# Patient Record
Sex: Female | Born: 1990 | Race: White | Hispanic: No | Marital: Married | Smoking: Never smoker
Health system: Southern US, Community
[De-identification: ages and names within clinical notes are randomized; demographics above are authoritative.]

## PROBLEM LIST (undated history)

## (undated) ENCOUNTER — Inpatient Hospital Stay (HOSPITAL_COMMUNITY): Payer: Self-pay

## (undated) DIAGNOSIS — F419 Anxiety disorder, unspecified: Secondary | ICD-10-CM

## (undated) HISTORY — PX: WISDOM TOOTH EXTRACTION: SHX21

---
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
## 2012-06-04 ENCOUNTER — Inpatient Hospital Stay (HOSPITAL_COMMUNITY)
Admission: AD | Admit: 2012-06-04 | Discharge: 2012-06-04 | Disposition: A | Discharge status: Home / Self Care | Payer: Medicaid Other | Source: Ambulatory Visit | Attending: Obstetrics and Gynecology | Admitting: Obstetrics and Gynecology

## 2012-06-04 DIAGNOSIS — N949 Unspecified condition associated with female genital organs and menstrual cycle: Secondary | ICD-10-CM | POA: Insufficient documentation

## 2012-06-04 DIAGNOSIS — B373 Candidiasis of vulva and vagina: Secondary | ICD-10-CM | POA: Insufficient documentation

## 2012-06-04 DIAGNOSIS — B3731 Acute candidiasis of vulva and vagina: Secondary | ICD-10-CM | POA: Insufficient documentation

## 2012-06-04 LAB — WET PREP, GENITAL: Trich, Wet Prep: NONE SEEN

## 2012-06-04 MED ORDER — CLOBETASOL PROPIONATE 0.05 % EX CREA: TOPICAL_CREAM | Freq: Once | CUTANEOUS | Status: DC

## 2012-06-04 MED ORDER — BETAMETHASONE VALERATE 0.1 % EX OINT
TOPICAL_OINTMENT | Freq: Two times a day (BID) | CUTANEOUS | Status: DC
  Filled 2012-06-04: qty 15

## 2012-06-04 MED ORDER — FLUCONAZOLE 150 MG PO TABS: 150.0000 mg | ORAL_TABLET | ORAL | Status: DC

## 2012-06-04 MED ORDER — HYDROCODONE-ACETAMINOPHEN 5-325 MG PO TABS
1.0000 | ORAL_TABLET | Freq: Once | ORAL | Status: AC
  Administered 2012-06-04: 1 via ORAL
  Filled 2012-06-04: qty 1

## 2012-06-04 NOTE — MAU Note (Signed)
Patient presents to MAU with c/o noting yellow discharge on Sunday; was seen on Monday and received Flagyl and oral treatment for yeast infection. Began both of those today.  Reports vaginal bleeding since Monday.

## 2012-06-04 NOTE — MAU Note (Signed)
Reports yellow vaginal discharge x 2 days, reports vaginal swelling and irritation.

## 2012-06-04 NOTE — MAU Provider Note (Signed)
History     CSN: [id number]  Arrival date and time: 06/04/12 2012   None     Chief Complaint  Patient presents with  . Groin Swelling  . Vaginal Discharge   HPI  Tami Powers is a 22 y.o. G1P1001 who presents today with vaginal swelling and discharge. She went to her OBGYN on Wednesday and had a pap smear. He told her he was "pretty sure" she had a STD. GC/CT cultures are pending. She complains of itching, and thick white discharge. She was given RX for flagyl and something for yeast. She started them both yesterday.   Past Medical History  Diagnosis Date  . Anxiety     Past Surgical History  Procedure Laterality Date  . Wisdom tooth extraction      No family history on file.  History  Substance Use Topics  . Smoking status: Never Smoker   . Smokeless tobacco: Never Used  . Alcohol Use: No    Allergies: No Known Allergies  Prescriptions prior to admission  Medication Sig Dispense Refill  . clotrimazole (LOTRIMIN) 1 % cream Apply topically 2 (two) times daily.      Marland Kitchen levothyroxine (LEVOTHROID) 25 MCG tablet Take 25 mcg by mouth daily.      . metroNIDAZOLE (FLAGYL) 250 MG tablet Take 250 mg by mouth 3 (three) times daily.      . [DISCONTINUED] ciprofloxacin (CIPRO) 500 MG tablet Take 500 mg by mouth 2 (two) times daily.        . [DISCONTINUED] etonogestrel (IMPLANON) 68 MG IMPL implant Inject 1 each into the skin once.        . [DISCONTINUED] phentermine (ADIPEX-P) 37.5 MG tablet Take 37.5 mg by mouth daily.          Review of Systems  Constitutional: Negative for fever.  Gastrointestinal: Negative for nausea, vomiting and abdominal pain.  Genitourinary: Negative for dysuria, urgency and frequency.   Physical Exam   Blood pressure 98/50, pulse 84, temperature 98.5 F (36.9 C), temperature source Oral, resp. rate 20, height 5\' 1"  (1.549 m), weight 79.379 kg (175 lb), last menstrual period 04/01/2012, SpO2 100.00%.  Physical Exam  Nursing note and vitals  reviewed. Constitutional: She is oriented to person, place, and time. She appears well-developed and well-nourished. No distress.  Genitourinary:  External: very swollen and erythematous Vagina: thick, white, adherent discharge.  Cervix: pink, smooth. No CMT Uterus: NT, AV, NSSC Adnexa: unable to palpate  Neurological: She is alert and oriented to person, place, and time.  Skin: Skin is warm and dry.  Psychiatric: She has a normal mood and affect.    MAU Course  Procedures  Results for orders placed during the hospital encounter of 06/04/12 (from the past 24 hour(s))  POCT PREGNANCY, URINE     Status: None   Collection Time    06/04/12 10:03 PM      Result Value Range   Preg Test, Ur NEGATIVE  NEGATIVE  WET PREP, GENITAL     Status: Abnormal   Collection Time    06/04/12 10:20 PM      Result Value Range   Yeast Wet Prep HPF POC FEW (*) NONE SEEN   Trich, Wet Prep NONE SEEN  NONE SEEN   Clue Cells Wet Prep HPF POC FEW (*) NONE SEEN   WBC, Wet Prep HPF POC FEW (*) NONE SEEN     Assessment and Plan   1. Yeast infection involving the vagina and surrounding area  Betamethasone ointment BID for vulvar swelling Diflucan Call PCP if sx worsen GC/CT pending Tawnya Crook 06/04/2012, 10:12 PM

## 2012-06-05 LAB — GC/CHLAMYDIA PROBE AMP: CT Probe RNA: NEGATIVE

## 2012-06-07 NOTE — MAU Provider Note (Signed)
Attestation of Attending Supervision of Advanced Practitioner (CNM/NP): Evaluation and management procedures were performed by the Advanced Practitioner under my supervision and collaboration.  I have reviewed the Advanced Practitioner's note and chart, and I agree with the management and plan.  Pricella Gaugh 06/07/2012 9:34 AM   

## 2014-02-16 ENCOUNTER — Encounter (HOSPITAL_COMMUNITY): Payer: Self-pay | Admitting: *Deleted

## 2014-12-02 ENCOUNTER — Ambulatory Visit (INDEPENDENT_AMBULATORY_CARE_PROVIDER_SITE_OTHER): Payer: Medicaid Other | Admitting: Certified Nurse Midwife

## 2014-12-02 ENCOUNTER — Encounter: Payer: Self-pay | Admitting: Certified Nurse Midwife

## 2014-12-02 VITALS — BP 106/69 | HR 90 | Temp 98.9°F | Wt 179.0 lb

## 2014-12-02 DIAGNOSIS — Z3480 Encounter for supervision of other normal pregnancy, unspecified trimester: Secondary | ICD-10-CM | POA: Insufficient documentation

## 2014-12-02 DIAGNOSIS — O2690 Pregnancy related conditions, unspecified, unspecified trimester: Secondary | ICD-10-CM

## 2014-12-02 DIAGNOSIS — Z3689 Encounter for other specified antenatal screening: Secondary | ICD-10-CM

## 2014-12-02 DIAGNOSIS — Z36 Encounter for antenatal screening of mother: Secondary | ICD-10-CM

## 2014-12-02 DIAGNOSIS — Z3482 Encounter for supervision of other normal pregnancy, second trimester: Secondary | ICD-10-CM | POA: Diagnosis not present

## 2014-12-02 LAB — POCT URINALYSIS DIPSTICK
BILIRUBIN UA: NEGATIVE
GLUCOSE UA: NEGATIVE
KETONES UA: NEGATIVE
Leukocytes, UA: NEGATIVE
NITRITE UA: NEGATIVE
PH UA: 7
Protein, UA: NEGATIVE
RBC UA: NEGATIVE
Spec Grav, UA: 1.015
Urobilinogen, UA: NEGATIVE

## 2014-12-02 LAB — POCT URINE PREGNANCY: Preg Test, Ur: POSITIVE — AB

## 2014-12-02 MED ORDER — ONDANSETRON HCL 4 MG PO TABS: 4.0000 mg | ORAL_TABLET | Freq: Every day | ORAL | Status: DC | PRN

## 2014-12-02 MED ORDER — VITAFOL ULTRA 29-0.6-0.4-200 MG PO CAPS: 1.0000 | ORAL_CAPSULE | Freq: Every day | ORAL | Status: DC

## 2014-12-02 NOTE — Progress Notes (Signed)
Subjective:    Tami Powers is being seen today for her first obstetrical visit.  This is not a planned pregnancy. She has a history of irregular periods for 3 years.  Mom passed away in July 08, 2022 and had spotting then.  Had nexplanon removed 3 years ago.  She is at Unknown gestation. Her obstetrical history is significant for none. Relationship with FOB: significant other, living together. Patient does intend to breast feed. Pregnancy history fully reviewed.  Is raising her younger brother.    The information documented in the HPI was reviewed and verified.  Menstrual History: OB History    Gravida Para Term Preterm AB TAB SAB Ectopic Multiple Living   2 1 1       1      Last baby 8#6oz Menarche age: 24-59 years of age  No LMP recorded. Patient is pregnant.    Past Medical History  Diagnosis Date  . Anxiety     Past Surgical History  Procedure Laterality Date  . Wisdom tooth extraction       (Not in a hospital admission) No Known Allergies  Social History  Substance Use Topics  . Smoking status: Never Smoker   . Smokeless tobacco: Never Used  . Alcohol Use: No    Family History  Problem Relation Age of Onset  . Hypertension Mother   . Hypertension Maternal Grandmother    Mom passed away from unknown causes of multiple drug use.    Review of Systems Constitutional: negative for weight loss Gastrointestinal: + for vomiting Genitourinary:negative for genital lesions and vaginal discharge and dysuria Musculoskeletal:negative for back pain Behavioral/Psych: negative for abusive relationship, depression, illegal drug usage and tobacco use    Objective:    BP 106/69 mmHg  Pulse 90  Temp(Src) 98.9 F (37.2 C)  Wt 179 lb (81.194 kg) General Appearance:    Alert, cooperative, no distress, appears stated age  Head:    Normocephalic, without obvious abnormality, atraumatic  Eyes:    PERRL, conjunctiva/corneas clear, EOM's intact, fundi    benign, both eyes  Ears:    Normal  TM's and external ear canals, both ears  Nose:   Nares normal, septum midline, mucosa normal, no drainage    or sinus tenderness  Throat:   Lips, mucosa, and tongue normal; teeth and gums normal  Neck:   Supple, symmetrical, trachea midline, no adenopathy;    thyroid:  no enlargement/tenderness/nodules; no carotid   bruit or JVD  Back:     Symmetric, no curvature, ROM normal, no CVA tenderness  Lungs:     Clear to auscultation bilaterally, respirations unlabored  Chest Wall:    No tenderness or deformity   Heart:    Regular rate and rhythm, S1 and S2 normal, no murmur, rub   or gallop  Breast Exam:    No tenderness, masses, or nipple abnormality  Abdomen:     Soft, non-tender, bowel sounds active all four quadrants,    no masses, no organomegaly  Genitalia:    Normal female without lesion, discharge or tenderness  Extremities:   Extremities normal, atraumatic, no cyanosis or edema  Pulses:   2+ and symmetric all extremities  Skin:   Skin color, texture, turgor normal, no rashes or lesions  Lymph nodes:   Cervical, supraclavicular, and axillary nodes normal  Neurologic:   CNII-XII intact, normal strength, sensation and reflexes    throughout    Cervix;  Long, thick, closed, posterior.  Fundus measures 22 weeks.  FHR by  doppler: 150.      Lab Review Urine pregnancy test Labs reviewed yes Radiologic studies reviewed no Assessment:    Pregnancy at Unknown weeks   Depression/anxiety d/t recent emotional trauma N&V in pregnancy   Plan:     [facility] Prenatal vitamins.  Counseling provided regarding continued use of seat belts, cessation of alcohol consumption, smoking or use of illicit drugs; infection precautions i.e., influenza/TDAP immunizations, toxoplasmosis,CMV, parvovirus, listeria and varicella; workplace safety, exercise during pregnancy; routine dental care, safe medications, sexual activity, hot tubs, saunas, pools, travel, caffeine use, fish and methlymercury,  potential toxins, hair treatments, varicose veins Weight gain recommendations per IOM guidelines reviewed: underweight/BMI< 18.5--> gain 28 - 40 lbs; normal weight/BMI 18.5 - 24.9--> gain 25 - 35 lbs; overweight/BMI 25 - 29.9--> gain 15 - 25 lbs; obese/BMI >30->gain  11 - 20 lbs Problem list reviewed and updated. FIRST/CF mutation testing/NIPT/QUAD SCREEN/fragile X/Ashkenazi Jewish population testing/Spinal muscular atrophy discussed: ordered. Role of ultrasound in pregnancy discussed; fetal survey: ordered. Amniocentesis discussed: not indicated. VBAC calculator score: VBAC consent form provided Meds ordered this encounter  Medications  . ondansetron (ZOFRAN) 4 MG tablet    Sig: Take 1 tablet (4 mg total) by mouth daily as needed.    Dispense:  30 tablet    Refill:  1  . Prenat-Fe Poly-Methfol-FA-DHA (VITAFOL ULTRA) 29-0.6-0.4-200 MG CAPS    Sig: Take 1 tablet by mouth daily at 10 pm.    Dispense:  30 capsule    Refill:  12   Orders Placed This Encounter  Procedures  . Culture, OB Urine  . SureSwab, Vaginosis/Vaginitis Plus  . US OB Comp + 14 Wk    Standing Status: Future     Number of Occurrences:      Standing Expiration Date: 02/01/2016    Order Specific Question:  Reason for Exam (SYMPTOM  OR DIAGNOSIS REQUIRED)    Answer:  anatomy    Order Specific Question:  Preferred imaging location?    Answer:  [facility]  . Obstetric panel  . HIV antibody  . Hemoglobinopathy evaluation  . Varicella zoster antibody, IgG  . Vit D  25 hydroxy (rtn osteoporosis monitoring)  . TSH  . AFP, Quad Screen    Order Specific Question:  Repeat Sample    Answer:  No    Order Specific Question:  Maternal Race    Answer:  caucasian    Order Specific Question:  EDD    Answer:  04/07/2015    Order Specific Question:  Brief History NTD    Answer:  no    Order Specific Question:  Pregnancy Donor Egg (Y/N)    Answer:  No    Order Specific Question:  Gest Age at U/S (Wk.Dy)    Answer:  none     Order Specific Question:  LMP:    Answer:  07/02/2014    Order Specific Question:  Number of Fetuses    Answer:  1    Order Specific Question:  Hx of OSB/NTD?    Answer:  No    Order Specific Question:  History of Down Syndrome?    Answer:  No    Order Specific Question:  Maternal IDDM (insulin-dependent diabetes mellitus)    Answer:  No    Order Specific Question:  Maternal Weight (lbs)    Answer:  179  . POCT urinalysis dipstick  . POCT urine pregnancy    Follow up in 2 weeks. 50% of 30 min visit spent on counseling and  coordination of care.

## 2014-12-03 LAB — AFP, QUAD SCREEN
AFP: 40.1 ng/mL
CURR GEST AGE: 22 wks.days
Down Syndrome Scr Risk Est: 1:618 {titer}
HCG TOTAL: 24.59 [IU]/mL
INH: 225.5 pg/mL
Interpretation-AFP: NEGATIVE
MOM FOR AFP: 0.59
MOM FOR HCG: 1.33
MoM for INH: 1.1
Open Spina bifida: NEGATIVE
Osb Risk: <1:27300 {titer}
TRI 18 SCR RISK EST: NEGATIVE
UE3 MOM: 0.76
UE3 VALUE: 1.78 ng/mL

## 2014-12-03 LAB — CULTURE, OB URINE
COLONY COUNT: NO GROWTH
ORGANISM ID, BACTERIA: NO GROWTH

## 2014-12-03 LAB — VITAMIN D 25 HYDROXY (VIT D DEFICIENCY, FRACTURES): VIT D 25 HYDROXY: 22 ng/mL — AB (ref 30–100)

## 2014-12-03 LAB — PAP IG W/ RFLX HPV ASCU

## 2014-12-03 LAB — TSH: TSH: 1.178 u[IU]/mL (ref 0.350–4.500)

## 2014-12-03 LAB — HIV ANTIBODY (ROUTINE TESTING W REFLEX): HIV: NONREACTIVE

## 2014-12-04 LAB — HEMOGLOBINOPATHY EVALUATION
Hemoglobin Other: 0 %
Hgb A2 Quant: 2.1 % — ABNORMAL LOW (ref 2.2–3.2)
Hgb A: 97.9 % — ABNORMAL HIGH (ref 96.8–97.8)
Hgb F Quant: 0 % (ref 0.0–2.0)
Hgb S Quant: 0 %

## 2014-12-06 ENCOUNTER — Other Ambulatory Visit: Payer: Self-pay | Admitting: Certified Nurse Midwife

## 2014-12-06 LAB — SURESWAB, VAGINOSIS/VAGINITIS PLUS
ATOPOBIUM VAGINAE: 7.2 Log (cells/mL)
C. TRACHOMATIS RNA, TMA: NOT DETECTED
C. albicans, DNA: NOT DETECTED
C. glabrata, DNA: NOT DETECTED
C. parapsilosis, DNA: NOT DETECTED
C. tropicalis, DNA: NOT DETECTED
LACTOBACILLUS SPECIES: NOT DETECTED Log (cells/mL)
MEGASPHAERA SPECIES: 7.8 Log (cells/mL)
N. gonorrhoeae RNA, TMA: NOT DETECTED
T. VAGINALIS RNA, QL TMA: NOT DETECTED

## 2014-12-07 LAB — OBSTETRIC PANEL
Antibody Screen: NEGATIVE
BASOS ABS: 0 10*3/uL (ref 0.0–0.1)
Basophils Relative: 0 % (ref 0–1)
EOS PCT: 0 % (ref 0–5)
Eosinophils Absolute: 0 10*3/uL (ref 0.0–0.7)
HCT: 37.9 % (ref 36.0–46.0)
HEP B S AG: NEGATIVE
Hemoglobin: 12.6 g/dL (ref 12.0–15.0)
LYMPHS ABS: 1.3 10*3/uL (ref 0.7–4.0)
LYMPHS PCT: 23 % (ref 12–46)
MCH: 29.3 pg (ref 26.0–34.0)
MCHC: 33.2 g/dL (ref 30.0–36.0)
MCV: 88.1 fL (ref 78.0–100.0)
MONO ABS: 0.4 10*3/uL (ref 0.1–1.0)
MPV: 8.5 fL — AB (ref 8.6–12.4)
Monocytes Relative: 8 % (ref 3–12)
NEUTROS ABS: 3.8 10*3/uL (ref 1.7–7.7)
Neutrophils Relative %: 69 % (ref 43–77)
PLATELETS: 206 10*3/uL (ref 150–400)
RBC: 4.3 MIL/uL (ref 3.87–5.11)
RDW: 14.9 % (ref 11.5–15.5)
RUBELLA: 1.89 {index} — AB (ref <0.90)
Rh Type: POSITIVE
WBC: 5.5 10*3/uL (ref 4.0–10.5)

## 2014-12-07 LAB — VARICELLA ZOSTER ANTIBODY, IGG: VARICELLA IGG: 1675 {index} — AB (ref <135.00)

## 2014-12-09 ENCOUNTER — Ambulatory Visit (HOSPITAL_COMMUNITY): Admission: RE | Admit: 2014-12-09 | Payer: Medicaid Other | Source: Ambulatory Visit

## 2014-12-15 ENCOUNTER — Encounter: Payer: Medicaid Other | Admitting: Certified Nurse Midwife

## 2014-12-15 ENCOUNTER — Ambulatory Visit (HOSPITAL_COMMUNITY): Payer: Medicaid Other

## 2014-12-28 ENCOUNTER — Ambulatory Visit (HOSPITAL_COMMUNITY)
Admission: RE | Admit: 2014-12-28 | Discharge: 2014-12-28 | Disposition: A | Discharge status: Home / Self Care | Payer: Medicaid Other | Source: Ambulatory Visit | Attending: Obstetrics | Admitting: Obstetrics

## 2014-12-28 DIAGNOSIS — Z36 Encounter for antenatal screening of mother: Secondary | ICD-10-CM | POA: Insufficient documentation

## 2014-12-28 DIAGNOSIS — Z3689 Encounter for other specified antenatal screening: Secondary | ICD-10-CM

## 2014-12-29 ENCOUNTER — Ambulatory Visit (INDEPENDENT_AMBULATORY_CARE_PROVIDER_SITE_OTHER): Payer: Medicaid Other | Admitting: Certified Nurse Midwife

## 2014-12-29 ENCOUNTER — Encounter: Payer: Self-pay | Admitting: Certified Nurse Midwife

## 2014-12-29 VITALS — BP 104/68 | HR 102 | Wt 185.0 lb

## 2014-12-29 DIAGNOSIS — Z3482 Encounter for supervision of other normal pregnancy, second trimester: Secondary | ICD-10-CM

## 2014-12-29 LAB — POCT URINALYSIS DIPSTICK
Bilirubin, UA: NEGATIVE
GLUCOSE UA: NEGATIVE
Ketones, UA: NEGATIVE
LEUKOCYTES UA: NEGATIVE
NITRITE UA: NEGATIVE
Protein, UA: NEGATIVE
RBC UA: NEGATIVE
Spec Grav, UA: 1.015
UROBILINOGEN UA: NEGATIVE
pH, UA: 6

## 2014-12-29 MED ORDER — CITRANATAL HARMONY 30-1-260 MG PO CAPS
1.0000 | ORAL_CAPSULE | Freq: Every day | ORAL | Status: DC
Start: 2014-12-29 — End: 2015-02-06

## 2014-12-29 NOTE — Progress Notes (Signed)
Subjective:    Tami Powers is a 24 y.o. female being seen today for her obstetrical visit. She is at [redacted]w[redacted]d gestation. Patient reports: nausea, no bleeding, no leaking, vomiting and unable to keep PNV down.  Had a bad experience for her ultrasound accross at [facility] hospital with the ultrasound tech.  Recently lost her only family (mom) then found out she was pregnant.  Is having problems accepting this pregnancy.  Declines medication at this time.  Referral to [facility] done.  Needs appointment ASAP.  Discussed and reviewed ultrasound and lab work results.  States she feels cramping at night, is working 2 jobs and taking care of her brother.  Spouse cheated on her at the start of this pregnancy, she states he is involve now.  ?Hx of herpes, denies any outbreaks.  States she drinks about 3 bottles of water/day, encouraged 8 bottles of water.  States she is not excited about this pregnancy, teary eyed during exam. Fetal movement: normal.  Problem List Items Addressed This Visit    None    Visit Diagnoses    Supervision of other normal pregnancy, antepartum, second trimester    -  Primary    Relevant Orders    [place] MFM OB LIMITED      Patient Active Problem List   Diagnosis Date Noted  . Supervision of other normal pregnancy 12/02/2014   Objective:    BP 104/68 mmHg  Pulse 102  Wt 185 lb (83.915 kg) FHT: 140 BPM  Uterine Size: size equals dates     Assessment:    Pregnancy @ [redacted]w[redacted]d    Plan:    OBGCT: discussed. Signs and symptoms of preterm labor: discussed.  Labs, problem list reviewed and updated 2 hr GTT planned Follow up in 4 weeks.

## 2014-12-29 NOTE — Addendum Note (Signed)
Addended by: Marya Landry D on: 12/29/2014 12:05 PM   Modules accepted: Orders

## 2014-12-29 NOTE — Addendum Note (Signed)
Addended by: Marya Landry D on: 12/29/2014 02:50 PM   Modules accepted: Orders

## 2015-01-28 ENCOUNTER — Ambulatory Visit (INDEPENDENT_AMBULATORY_CARE_PROVIDER_SITE_OTHER): Payer: Medicaid Other | Admitting: Certified Nurse Midwife

## 2015-01-28 ENCOUNTER — Ambulatory Visit (INDEPENDENT_AMBULATORY_CARE_PROVIDER_SITE_OTHER): Payer: Medicaid Other

## 2015-01-28 VITALS — BP 119/69 | HR 99 | Temp 98.3°F | Wt 190.0 lb

## 2015-01-28 DIAGNOSIS — Z3482 Encounter for supervision of other normal pregnancy, second trimester: Secondary | ICD-10-CM

## 2015-01-28 DIAGNOSIS — Z0373 Encounter for suspected fetal anomaly ruled out: Secondary | ICD-10-CM | POA: Diagnosis not present

## 2015-01-28 NOTE — Progress Notes (Signed)
Subjective:    Tami RighterDestiny Powers is a 24 y.o. female being seen today for her obstetrical visit. She is at 4177w5d gestation. Patient reports: no complaints . Fetal movement: normal.  Problem List Items Addressed This Visit    None     Patient Active Problem List   Diagnosis Date Noted  . Supervision of other normal pregnancy 12/02/2014   Objective:    BP 119/69 mmHg  Pulse 99  Temp(Src) 98.3 F (36.8 C)  Wt 190 lb (86.183 kg) FHT: 145 BPM  Uterine Size: size greater than dates     Assessment:    Pregnancy @ 4977w5d    Doing well  Plan:    OBGCT: discussed and ordered for next visit. Signs and symptoms of preterm labor: discussed.  Labs, problem list reviewed and updated 2 hr GTT planned Follow up in 2 weeks.

## 2015-02-01 ENCOUNTER — Telehealth: Payer: Self-pay | Admitting: *Deleted

## 2015-02-01 NOTE — Telephone Encounter (Signed)
Patient states she is running a low grade fever for several days. 2:46 Call to patient- patient has not actually taken her temperature. She just doesn't feel good. She feels nauseous at times and her reflux is bad. She wonders if she is diabetic. Appointment given.

## 2015-02-02 ENCOUNTER — Ambulatory Visit (INDEPENDENT_AMBULATORY_CARE_PROVIDER_SITE_OTHER): Payer: Medicaid Other | Admitting: Certified Nurse Midwife

## 2015-02-02 VITALS — BP 106/71 | HR 94 | Temp 98.2°F | Wt 190.0 lb

## 2015-02-02 DIAGNOSIS — G4700 Insomnia, unspecified: Secondary | ICD-10-CM

## 2015-02-02 DIAGNOSIS — F32A Depression, unspecified: Secondary | ICD-10-CM

## 2015-02-02 DIAGNOSIS — O99342 Other mental disorders complicating pregnancy, second trimester: Secondary | ICD-10-CM

## 2015-02-02 DIAGNOSIS — F329 Major depressive disorder, single episode, unspecified: Secondary | ICD-10-CM

## 2015-02-02 DIAGNOSIS — Z3482 Encounter for supervision of other normal pregnancy, second trimester: Secondary | ICD-10-CM

## 2015-02-02 LAB — POCT URINALYSIS DIPSTICK
Bilirubin, UA: NEGATIVE
Blood, UA: NEGATIVE
Glucose, UA: NEGATIVE
KETONES UA: NEGATIVE
LEUKOCYTES UA: NEGATIVE
NITRITE UA: NEGATIVE
PH UA: 7.5
PROTEIN UA: NEGATIVE
Spec Grav, UA: 1.01
UROBILINOGEN UA: NEGATIVE

## 2015-02-02 MED ORDER — SERTRALINE HCL 50 MG PO TABS: 50.0000 mg | ORAL_TABLET | Freq: Every day | ORAL | Status: DC

## 2015-02-02 MED ORDER — DOXYLAMINE SUCCINATE (SLEEP) 25 MG PO TABS: 25.0000 mg | ORAL_TABLET | Freq: Every evening | ORAL | Status: DC | PRN

## 2015-02-02 MED ORDER — DIPHENHYDRAMINE HCL 25 MG PO TABS: 25.0000 mg | ORAL_TABLET | Freq: Every evening | ORAL | Status: DC | PRN

## 2015-02-02 NOTE — Progress Notes (Signed)
Pt states that she has not been feeling well this week.  Pt states that she will feel hot and clammy, hot and cold at the same time.  Pt states that she may need depression medication after last discussion with Rachelle.  Pt states that she is feeling more stressed.  Pt states that she is not having much of an appetite and not eating much.  Pt was advised that she needs to increase fluids and eat as she is able.

## 2015-02-03 NOTE — Progress Notes (Signed)
Subjective:    Tami RighterDestiny Powers is a 24 y.o. female being seen today for her obstetrical visit. She is at 2682w4d gestation. Patient reports: backache, nausea, no bleeding, no contractions, no cramping, no leaking and vomiting. Fetal movement: normal.  Encouraged patient to speak with [facility], patient agreed. Is having adjustment/altered grief issues since the passing of her mother.  States she is having a hard time sleeping.  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  States she feels very stressed and does not want that to affect the pregnancy.  She states that she is ready to try antidepressant medication.  She states that for the last few days she has felt like she has had the flu with chills, sweating, weakness and no appetite, has occasionally vomited and had nausea.    Problem List Items Addressed This Visit    None    Visit Diagnoses    Encounter for supervision of other normal pregnancy in second trimester    -  Primary    Relevant Orders    POCT urinalysis dipstick (Completed)    Depression affecting pregnancy in second trimester, antepartum        Relevant Medications    sertraline (ZOLOFT) 50 MG tablet    Insomnia        Relevant Medications    doxylamine, Sleep, (UNISOM) 25 MG tablet    diphenhydrAMINE (BENADRYL) 25 MG tablet      Patient Active Problem List   Diagnosis Date Noted  . Supervision of other normal pregnancy 12/02/2014   Objective:    BP 106/71 mmHg  Pulse 94  Temp(Src) 98.2 F (36.8 C)  Wt 190 lb (86.183 kg) FHT: 150 BPM  Uterine Size: size equals dates     Assessment:    Pregnancy @ 3982w4d    Depression  Fatigue  Insomnia  Plan:    OBGCT: discussed and ordered for next visit. Signs and symptoms of preterm labor: discussed.  Labs, problem list reviewed and updated 2 hr GTT planned Follow up in 2 weeks.

## 2015-02-06 ENCOUNTER — Encounter (HOSPITAL_COMMUNITY): Payer: Self-pay

## 2015-02-06 ENCOUNTER — Inpatient Hospital Stay (HOSPITAL_COMMUNITY)
Admission: AD | Admit: 2015-02-06 | Discharge: 2015-02-06 | Disposition: A | Discharge status: Home / Self Care | Payer: Medicaid Other | Source: Ambulatory Visit | Attending: Obstetrics | Admitting: Obstetrics

## 2015-02-06 DIAGNOSIS — O23592 Infection of other part of genital tract in pregnancy, second trimester: Secondary | ICD-10-CM | POA: Insufficient documentation

## 2015-02-06 DIAGNOSIS — B9689 Other specified bacterial agents as the cause of diseases classified elsewhere: Secondary | ICD-10-CM | POA: Diagnosis not present

## 2015-02-06 DIAGNOSIS — E860 Dehydration: Secondary | ICD-10-CM | POA: Diagnosis not present

## 2015-02-06 DIAGNOSIS — Z3A27 27 weeks gestation of pregnancy: Secondary | ICD-10-CM | POA: Insufficient documentation

## 2015-02-06 DIAGNOSIS — N760 Acute vaginitis: Secondary | ICD-10-CM | POA: Diagnosis not present

## 2015-02-06 DIAGNOSIS — R109 Unspecified abdominal pain: Secondary | ICD-10-CM | POA: Diagnosis present

## 2015-02-06 DIAGNOSIS — R102 Pelvic and perineal pain: Secondary | ICD-10-CM | POA: Insufficient documentation

## 2015-02-06 LAB — WET PREP, GENITAL
TRICH WET PREP: NONE SEEN
YEAST WET PREP: NONE SEEN

## 2015-02-06 LAB — URINALYSIS, ROUTINE W REFLEX MICROSCOPIC
BILIRUBIN URINE: NEGATIVE
Glucose, UA: NEGATIVE mg/dL
HGB URINE DIPSTICK: NEGATIVE
KETONES UR: NEGATIVE mg/dL
Leukocytes, UA: NEGATIVE
Nitrite: NEGATIVE
PROTEIN: NEGATIVE mg/dL
UROBILINOGEN UA: 1 mg/dL (ref 0.0–1.0)
pH: 6 (ref 5.0–8.0)

## 2015-02-06 MED ORDER — METRONIDAZOLE 500 MG PO TABS
500.0000 mg | ORAL_TABLET | Freq: Two times a day (BID) | ORAL | Status: DC
Start: 2015-02-06 — End: 2015-03-30

## 2015-02-06 NOTE — MAU Note (Signed)
Pt c/o tightness in stomach x3 days-does not feel like contractions. Milky white vaginal discharge, no bleeding. +FM

## 2015-02-06 NOTE — Discharge Instructions (Signed)
Dehydration, Adult °Dehydration is a condition in which you do not have enough fluid or water in your body. It happens when you take in less fluid than you lose. Vital organs such as the kidneys, brain, and heart cannot function without a proper amount of fluids. Any loss of fluids from the body can cause dehydration.  °Dehydration can range from mild to severe. This condition should be treated right away to help prevent it from becoming severe. °CAUSES  °This condition may be caused by: °· Vomiting. °· Diarrhea. °· Excessive sweating, such as when exercising in hot or humid weather. °· Not drinking enough fluid during strenuous exercise or during an illness. °· Excessive urine output. °· Fever. °· Certain medicines. °RISK FACTORS °This condition is more likely to develop in: °· People who are taking certain medicines that cause the body to lose excess fluid (diuretics).   °· People who have a chronic illness, such as diabetes, that may increase urination. °· Older adults.   °· People who live at high altitudes.   °· People who participate in endurance sports.   °SYMPTOMS  °Mild Dehydration °· Thirst. °· Dry lips. °· Slightly dry mouth. °· Dry, warm skin. °Moderate Dehydration °· Very dry mouth.   °· Muscle cramps.   °· Dark urine and decreased urine production.   °· Decreased tear production.   °· Headache.   °· Light-headedness, especially when you stand up from a sitting position.   °Severe Dehydration °· Changes in skin.   °¨ Cold and clammy skin.   °¨ Skin does not spring back quickly when lightly pinched and released.   °· Changes in body fluids.   °¨ Extreme thirst.   °¨ No tears.   °¨ Not able to sweat when body temperature is high, such as in hot weather.   °¨ Minimal urine production.   °· Changes in vital signs.   °¨ Rapid, weak pulse (more than 100 beats per minute when you are sitting still).   °¨ Rapid breathing.   °¨ Low blood pressure.   °· Other changes.   °¨ Sunken eyes.   °¨ Cold hands and feet.    °¨ Confusion. °¨ Lethargy and difficulty being awakened. °¨ Fainting (syncope).   °¨ Short-term weight loss.   °¨ Unconsciousness. °DIAGNOSIS  °This condition may be diagnosed based on your symptoms. You may also have tests to determine how severe your dehydration is. These tests may include:  °· Urine tests.   °· Blood tests.   °TREATMENT  °Treatment for this condition depends on the severity. Mild or moderate dehydration can often be treated at home. Treatment should be started right away. Do not wait until dehydration becomes severe. Severe dehydration needs to be treated at the hospital. °Treatment for Mild Dehydration °· Drinking plenty of water to replace the fluid you have lost.   °· Replacing minerals in your blood (electrolytes) that you may have lost.   °Treatment for Moderate Dehydration  °· Consuming oral rehydration solution (ORS). °Treatment for Severe Dehydration °· Receiving fluid through an IV tube.   °· Receiving electrolyte solution through a feeding tube that is passed through your nose and into your stomach (nasogastric tube or NG tube). °· Correcting any abnormalities in electrolytes. °HOME CARE INSTRUCTIONS  °· Drink enough fluid to keep your urine clear or pale yellow.   °· Drink water or fluid slowly by taking small sips. You can also try sucking on ice cubes.  °· Have food or beverages that contain electrolytes. Examples include bananas and sports drinks. °· Take over-the-counter and prescription medicines only as told by your health care provider.   °· Prepare ORS according to the manufacturer's instructions. Take sips   of ORS every 5 minutes until your urine returns to normal.  If you have vomiting or diarrhea, continue to try to drink water, ORS, or both.   If you have diarrhea, avoid:   Beverages that contain caffeine.   Fruit juice.   Milk.   Carbonated soft drinks.  Do not take salt tablets. This can lead to the condition of having too much sodium in your body  (hypernatremia).  SEEK MEDICAL CARE IF:  You cannot eat or drink without vomiting.  You have had moderate diarrhea during a period of more than 24 hours.  You have a fever. SEEK IMMEDIATE MEDICAL CARE IF:   You have extreme thirst.  You have severe diarrhea.  You have not urinated in 6-8 hours, or you have urinated only a small amount of very dark urine.  You have shriveled skin.  You are dizzy, confused, or both.   This information is not intended to replace advice given to you by your health care provider. Make sure you discuss any questions you have with your health care provider.   Document Released: 04/03/2005 Document Revised: 12/23/2014 Document Reviewed: 08/19/2014 Elsevier Interactive Patient Education 2016 [employer]  Round Ligament Pain During Pregnancy   Round ligament pain is a sharp pain or jabbing feeling often felt in the lower belly or groin area on one or both sides. It is one of the most common complaints during pregnancy and is considered a normal part of pregnancy. It is most often felt during the second trimester.   Here is what you need to know about round ligament pain, including some tips to help you feel better.   Causes of Round Ligament Pain:    Several thick ligaments surround and support your womb (uterus) as it grows during pregnancy. One of them is called the round ligament.   The round ligament connects the front part of the womb to your groin, the area where your legs attach to your pelvis. The round ligament normally tightens and relaxes slowly.   As your baby and womb grow, the round ligament stretches. That makes it more likely to become strained.   Sudden movements can cause the ligament to tighten quickly, like a rubber band snapping. This causes a sudden and quick jabbing feeling.   Symptoms of Round Ligament Pain   Round ligament pain can be concerning and uncomfortable. But it is considered normal as your body changes during  pregnancy.   The symptoms of round ligament pain include a sharp, sudden spasm in the belly. It usually affects the right side, but it may happen on both sides. The pain only lasts a few seconds.   Exercise may cause the pain, as will rapid movements such as:   sneezing  coughing  laughing  rolling over in bed  standing up too quickly   Treatment of Round Ligament Pain   Here are some tips that may help reduce your discomfort:   Pain relief. Take over-the-counter acetaminophen for pain, if necessary. Ask your doctor if this is OK.   Exercise. Get plenty of exercise to keep your stomach (core) muscles strong. Doing stretching exercises or prenatal yoga can be helpful. Ask your doctor which exercises are safe for you and your baby.   A helpful exercise involves putting your hands and knees on the floor, lowering your head, and pushing your backside into the air.   Avoid sudden movements. Change positions slowly (such as standing up or sitting down) to avoid sudden  movements that may cause stretching and pain.   Flex your hips. Bend and flex your hips before you cough, sneeze, or laugh to avoid pulling on the ligaments.   Apply warmth. A heating pad or warm bath may be helpful. Ask your doctor if this is OK. Extreme heat can be dangerous to the baby.   You should try to modify your daily activity level and avoid positions that may worsen the condition.   When to Call the Doctor/Midwife   Always tell your doctor or midwife about any type of pain you have during pregnancy. Round ligament pain is quick and doesn't last long.   Call your health care provider immediately if you have:   severe pain  fever  chills  pain on urination  difficulty walking   Belly pain during pregnancy can be due to many different causes. It is important for your doctor to rule out more serious conditions, including pregnancy complications such as placenta abruption or non-pregnancy illnesses such as:    inguinal hernia  appendicitis  stomach, liver, and kidney problems  Preterm labor pains may sometimes be mistaken for round ligament pain.

## 2015-02-06 NOTE — MAU Provider Note (Signed)
History     CSN: [id number]  Arrival date and time: 02/06/15 2017   First Provider Initiated Contact with Patient 02/06/15 2113      Chief Complaint  Patient presents with  . Vaginal Discharge  . Abdominal Pain   HPI Tami Powers is a 24 y.o. G2P1001 at [redacted]w[redacted]d who presents to MAU today with complaint of intermittent abdominal tightening. She states that the tightening is across her entire abdomen. It occurs ~ 3 times per day for the last few weeks. She has told the CNM at [facility] about this in the past. She denies contractions, vaginal bleeding, LOF or complications with the pregnancy. She does endorse significant anxiety especially following the recent death of her mother. She endorses a small amount of milky white discharge without odor. She reports good fetal movement.   OB History    Gravida Para Term Preterm AB TAB SAB Ectopic Multiple Living   Past Medical History  Diagnosis Date  . Anxiety     Past Surgical History  Procedure Laterality Date  . Wisdom tooth extraction      Family History  Problem Relation Age of Onset  . Hypertension Mother   . Hypertension Maternal Grandmother     Social History  Substance Use Topics  . Smoking status: Never Smoker   . Smokeless tobacco: Never Used  . Alcohol Use: No    Allergies: No Known Allergies  Prescriptions prior to admission  Medication Sig Dispense Refill Last Dose  . diphenhydrAMINE (BENADRYL) 25 MG tablet Take 1 tablet (25 mg total) by mouth at bedtime as needed. (Patient not taking: Reported on 02/06/2015) 30 tablet 4   . doxylamine, Sleep, (UNISOM) 25 MG tablet Take 1 tablet (25 mg total) by mouth at bedtime as needed for sleep. (Patient not taking: Reported on 02/06/2015) 30 tablet 0   . ondansetron (ZOFRAN) 4 MG tablet Take 1 tablet (4 mg total) by mouth daily as needed. (Patient not taking: Reported on 12/29/2014) 30 tablet 1 Not Taking  . Prenat w/o A-FeCbn-DSS-FA-DHA (CITRANATAL  HARMONY) 30-1-260 MG CAPS Take 1 tablet by mouth at bedtime. (Patient not taking: Reported on 02/02/2015) 30 capsule 12 Not Taking  . Prenat-Fe Poly-Methfol-FA-DHA (VITAFOL ULTRA) 29-0.6-0.4-200 MG CAPS Take 1 tablet by mouth daily at 10 pm. (Patient not taking: Reported on 12/29/2014) 30 capsule 12 Not Taking  . sertraline (ZOLOFT) 50 MG tablet Take 1 tablet (50 mg total) by mouth daily. (Patient not taking: Reported on 02/06/2015) 30 tablet 2     Review of Systems  Constitutional: Negative for fever and malaise/fatigue.  Gastrointestinal: Negative for nausea, vomiting, abdominal pain, diarrhea and constipation.  Genitourinary: Negative for dysuria, urgency and frequency.       Neg - vaginal bleeding, LOF + vaginal discharge  Neurological: Positive for dizziness and weakness. Negative for loss of consciousness.   Physical Exam   Blood pressure 110/59, pulse 93, temperature 98 F (36.7 C), temperature source Oral, resp. rate 18, height 5' (1.524 m), weight 194 lb (87.998 kg), SpO2 99 %.  Physical Exam  Nursing note and vitals reviewed. Constitutional: She is oriented to person, place, and time. She appears well-developed and well-nourished. No distress.  HENT:  Head: Normocephalic and atraumatic.  Cardiovascular: Normal rate.   Respiratory: Effort normal.  GI: Soft. She exhibits no distension and no mass. There is no tenderness. There is no rebound and no guarding.  Neurological:  She is alert and oriented to person, place, and time.  Skin: Skin is warm and dry. No erythema.  Psychiatric: She has a normal mood and affect.  Dilation: Closed Effacement (%): Thick Exam by:: Harlon FlorJ Wenzel PA-C   Results for orders placed or performed during the hospital encounter of 02/06/15 (from the past 24 hour(s))  Urinalysis, Routine w reflex microscopic (not [facility])     Status: Abnormal   Collection Time: 02/06/15  8:27 PM  Result Value Ref Range   Color, Urine YELLOW YELLOW   APPearance CLEAR  CLEAR   Specific Gravity, Urine >1.030 (H) 1.005 - 1.030   pH 6.0 5.0 - 8.0   Glucose, UA NEGATIVE NEGATIVE mg/dL   Hgb urine dipstick NEGATIVE NEGATIVE   Bilirubin Urine NEGATIVE NEGATIVE   Ketones, ur NEGATIVE NEGATIVE mg/dL   Protein, ur NEGATIVE NEGATIVE mg/dL   Urobilinogen, UA 1.0 0.0 - 1.0 mg/dL   Nitrite NEGATIVE NEGATIVE   Leukocytes, UA NEGATIVE NEGATIVE  Wet prep, genital     Status: Abnormal   Collection Time: 02/06/15  9:28 PM  Result Value Ref Range   Yeast Wet Prep HPF POC NONE SEEN NONE SEEN   Trich, Wet Prep NONE SEEN NONE SEEN   Clue Cells Wet Prep HPF POC FEW (A) NONE SEEN   WBC, Wet Prep HPF POC FEW (A) NONE SEEN   Fetal Monitoring: Baseline: 130 bpm, moderate variability, + accelerations, no decelerations Contractions: none  MAU Course  Procedures None  MDM UA today shows mild dehydration IV LR bolus offered. Patient would prefer to PO hydrate at home Wet prep obtained  Assessment and Plan  A: SIUP at 5326w0d Round ligament pain Dehydration, mild Bacterial vaginosis  P: Discharge home Rx for Flagyl given to patient Advised Tylenol PRN for pain Discussed use of abdominal binder for pain Increased PO hydration advised Preterm labor precautions discussed Patient advised to follow-up with [facility] as scheduled for routine prenatal care or sooner PRN Patient may return to MAU as needed or if her condition were to change or worsen   Marny LowensteinJulie N Wenzel, PA-C  02/06/2015, 9:59 PM

## 2015-02-11 ENCOUNTER — Ambulatory Visit (INDEPENDENT_AMBULATORY_CARE_PROVIDER_SITE_OTHER): Payer: Medicaid Other | Admitting: Certified Nurse Midwife

## 2015-02-11 ENCOUNTER — Other Ambulatory Visit: Payer: Medicaid Other

## 2015-02-11 VITALS — BP 117/68 | HR 98 | Wt 192.0 lb

## 2015-02-11 DIAGNOSIS — Z3482 Encounter for supervision of other normal pregnancy, second trimester: Secondary | ICD-10-CM

## 2015-02-11 LAB — POCT URINALYSIS DIPSTICK
BILIRUBIN UA: NEGATIVE
Blood, UA: NEGATIVE
GLUCOSE UA: NEGATIVE
KETONES UA: NEGATIVE
Leukocytes, UA: NEGATIVE
Nitrite, UA: NEGATIVE
Protein, UA: NEGATIVE
SPEC GRAV UA: 1.015
Urobilinogen, UA: NEGATIVE
pH, UA: 6

## 2015-02-11 LAB — CBC
HCT: 32.9 % — ABNORMAL LOW (ref 36.0–46.0)
HEMOGLOBIN: 11.3 g/dL — AB (ref 12.0–15.0)
MCH: 29 pg (ref 26.0–34.0)
MCHC: 34.3 g/dL (ref 30.0–36.0)
MCV: 84.4 fL (ref 78.0–100.0)
MPV: 8.5 fL — AB (ref 8.6–12.4)
Platelets: 215 10*3/uL (ref 150–400)
RBC: 3.9 MIL/uL (ref 3.87–5.11)
RDW: 13.4 % (ref 11.5–15.5)
WBC: 7.2 10*3/uL (ref 4.0–10.5)

## 2015-02-11 NOTE — Progress Notes (Signed)
Subjective:    Tami Powers is a 24 y.o. female being seen today for her obstetrical visit. She is at 4076w5d gestation. Patient reports: no complaints . Fetal movement: normal.  States that her depression is doing better.    Problem List Items Addressed This Visit    None    Visit Diagnoses    Supervision of other normal pregnancy, antepartum, second trimester    -  Primary    Relevant Orders    POCT urinalysis dipstick    Glucose Tolerance, 2 Hours w/1 Hour    CBC    HIV antibody    RPR      Patient Active Problem List   Diagnosis Date Noted  . Supervision of other normal pregnancy 12/02/2014   Objective:    BP 117/68 mmHg  Pulse 98  Wt 192 lb (87.091 kg) FHT: 145 BPM  Uterine Size: size equals dates   Transverse fetal position  Assessment:    Pregnancy @ 10076w5d    Doing well.   Plan:    OBGCT: ordered. Signs and symptoms of preterm labor: discussed.  Labs, problem list reviewed and updated 2 hr GTT today.  Follow up in 2 weeks.

## 2015-02-12 LAB — HIV ANTIBODY (ROUTINE TESTING W REFLEX): HIV 1&2 Ab, 4th Generation: NONREACTIVE

## 2015-02-12 LAB — GLUCOSE TOLERANCE, 2 HOURS W/ 1HR
GLUCOSE, 2 HOUR: 85 mg/dL (ref 70–139)
Glucose, 1 hour: 107 mg/dL (ref 70–170)
Glucose, Fasting: 70 mg/dL (ref 65–99)

## 2015-02-13 LAB — SYPHILIS: RPR W/REFLEX TO RPR TITER AND TREPONEMAL ANTIBODIES, TRADITIONAL SCREENING AND DIAGNOSIS ALGORITHM

## 2015-02-25 ENCOUNTER — Encounter: Payer: Medicaid Other | Admitting: Certified Nurse Midwife

## 2015-03-03 ENCOUNTER — Encounter: Payer: Medicaid Other | Admitting: Certified Nurse Midwife

## 2015-03-16 ENCOUNTER — Other Ambulatory Visit: Payer: Self-pay | Admitting: Certified Nurse Midwife

## 2015-03-17 ENCOUNTER — Encounter: Payer: Medicaid Other | Admitting: Certified Nurse Midwife

## 2015-03-18 ENCOUNTER — Inpatient Hospital Stay (HOSPITAL_COMMUNITY)
Admission: AD | Admit: 2015-03-18 | Discharge: 2015-03-18 | Disposition: A | Discharge status: Home / Self Care | Payer: Medicaid Other | Source: Ambulatory Visit | Attending: Obstetrics | Admitting: Obstetrics

## 2015-03-18 ENCOUNTER — Encounter (HOSPITAL_COMMUNITY): Payer: Self-pay

## 2015-03-18 DIAGNOSIS — Z3A32 32 weeks gestation of pregnancy: Secondary | ICD-10-CM | POA: Diagnosis not present

## 2015-03-18 DIAGNOSIS — O479 False labor, unspecified: Secondary | ICD-10-CM

## 2015-03-18 DIAGNOSIS — O4703 False labor before 37 completed weeks of gestation, third trimester: Secondary | ICD-10-CM | POA: Diagnosis present

## 2015-03-18 LAB — URINALYSIS, ROUTINE W REFLEX MICROSCOPIC
Bilirubin Urine: NEGATIVE
Glucose, UA: NEGATIVE mg/dL
HGB URINE DIPSTICK: NEGATIVE
Ketones, ur: NEGATIVE mg/dL
LEUKOCYTES UA: NEGATIVE
NITRITE: NEGATIVE
PROTEIN: NEGATIVE mg/dL
pH: 6 (ref 5.0–8.0)

## 2015-03-18 LAB — CBC
HEMATOCRIT: 30.7 % — AB (ref 36.0–46.0)
HEMOGLOBIN: 10 g/dL — AB (ref 12.0–15.0)
MCH: 26.7 pg (ref 26.0–34.0)
MCHC: 32.6 g/dL (ref 30.0–36.0)
MCV: 82.1 fL (ref 78.0–100.0)
Platelets: 214 10*3/uL (ref 150–400)
RBC: 3.74 MIL/uL — AB (ref 3.87–5.11)
RDW: 13.9 % (ref 11.5–15.5)
WBC: 7.4 10*3/uL (ref 4.0–10.5)

## 2015-03-18 LAB — COMPREHENSIVE METABOLIC PANEL
ALBUMIN: 2.8 g/dL — AB (ref 3.5–5.0)
ALK PHOS: 93 U/L (ref 38–126)
ALT: 12 U/L — AB (ref 14–54)
AST: 16 U/L (ref 15–41)
Anion gap: 7 (ref 5–15)
BILIRUBIN TOTAL: 0.8 mg/dL (ref 0.3–1.2)
CO2: 22 mmol/L (ref 22–32)
CREATININE: 0.39 mg/dL — AB (ref 0.44–1.00)
Calcium: 8.5 mg/dL — ABNORMAL LOW (ref 8.9–10.3)
Chloride: 104 mmol/L (ref 101–111)
GFR calc Af Amer: >60 mL/min (ref >60)
GLUCOSE: 87 mg/dL (ref 65–99)
POTASSIUM: 3.7 mmol/L (ref 3.5–5.1)
Sodium: 133 mmol/L — ABNORMAL LOW (ref 135–145)
TOTAL PROTEIN: 5.9 g/dL — AB (ref 6.5–8.1)

## 2015-03-18 MED ORDER — COMFORT FIT MATERNITY SUPP MED MISC: 1.0000 | Status: DC | PRN

## 2015-03-18 NOTE — MAU Provider Note (Signed)
History   [id number]   Chief Complaint  Patient presents with  . vaginal pressure     HPI Tami Powers is a 24 y.o. female  G2P1001 at 7346w5d IUP here with report of Deberah PeltonBraxton Hicks for "weeks" also report right sided hip pain that is aggravated with Deberah PeltonBraxton Hicks.  Feels Deberah PeltonBraxton Hicks has occurred 10x today.  Denies vaginal bleeding or leaking of fluid.  +nausea, no report of vomiting today.  Vomited x 2 yesterday.  No UTI symptoms.  Feels chills, no body aches or exposure to ill individuals.    No LMP recorded. Patient is pregnant.  OB History  Gravida Para Term Preterm AB SAB TAB Ectopic Multiple Living  2 1 1       1     # Outcome Date GA Lbr Len/2nd Weight Sex Delivery Anes PTL Lv  2 Current           1 Term 11/17/09 2826w0d   F Vag-Spont EPI N Y     Comments: vagninal tear      Past Medical History  Diagnosis Date  . Anxiety     Family History  Problem Relation Age of Onset  . Hypertension Mother   . Hypertension Maternal Grandmother     Social History   Social History  . Marital Status: Married    Spouse Name: N/A  . Number of Children: N/A  . Years of Education: N/A   Social History Main Topics  . Smoking status: Never Smoker   . Smokeless tobacco: Never Used  . Alcohol Use: No  . Drug Use: No  . Sexual Activity: Yes    Birth Control/ Protection: Implant   Other Topics Concern  . Not on file   Social History Narrative    No Known Allergies  No current facility-administered medications on file prior to encounter.   Current Outpatient Prescriptions on File Prior to Encounter  Medication Sig Dispense Refill  . metroNIDAZOLE (FLAGYL) 500 MG tablet Take 1 tablet (500 mg total) by mouth 2 (two) times daily. 14 tablet 0  . Prenat-Fe Poly-Methfol-FA-DHA (VITAFOL ULTRA) 29-0.6-0.4-200 MG CAPS Take 1 tablet by mouth daily at 10 pm. 30 capsule 12     Review of Systems  Constitutional: Positive for chills and fatigue. Negative for fever.  Gastrointestinal:  Positive for nausea. Negative for vomiting and abdominal pain.  Genitourinary: Negative for dysuria, hematuria, flank pain, vaginal bleeding and vaginal discharge.  Musculoskeletal: Positive for back pain.  Neurological: Positive for dizziness.  All other systems reviewed and are negative.    Physical Exam   Filed Vitals:   03/18/15 1412  BP: 110/58  Pulse: 90  Temp: 97.5 F (36.4 C)  Resp: 18  Height: 5\' 1"  (1.549 m)  Weight: 193 lb (87.544 kg)    Physical Exam  Constitutional: She is oriented to person, place, and time. She appears well-developed and well-nourished. No distress.  HENT:  Head: Normocephalic.  Mouth/Throat: Mucous membranes are not dry.  Neck: Normal range of motion. Neck supple.  Cardiovascular: Normal rate, regular rhythm and normal heart sounds.   Respiratory: Effort normal and breath sounds normal. No respiratory distress.  GI: Soft. There is no tenderness.  Genitourinary: No bleeding in the vagina. No vaginal discharge (mucusy) found.  Musculoskeletal: Normal range of motion. She exhibits no edema.  Neurological: She is alert and oriented to person, place, and time.  Skin: Skin is warm and dry.   Dilation: 1 Effacement (%): Thick Cervical Position: Posterior Exam by::  Margarita Mail CNM  MAU Course  Procedures  1450 Report given to V. Katrinka Blazing who assumes care of patient  Marlis Edelson, CNM  Dorathy Kinsman, CNM assumed care of patient at 1450. Labs pending.  No further contractions. Labs normal.  Assessment and Plan  24 y.o. G2P1001 at [redacted]w[redacted]d IUP  1. Braxton Hicks contractions    Discharge home in stable condition. Increase fluids and rest. Maternity support belt and comfort measures discussed. Preterm labor precautions and fetal kick counts. Strongly encouraged to keep prenatal appointments. Follow-up Information    Schedule an appointment as soon as possible for a visit with [facility].   Why:  Routine prenatal visit   Contact  information:   [address] [place] [address] [phone]      Follow up with [facility] [place] [facility].   Why:  As needed in emergencies   Contact information:   [address] [id number] mc [place] [address] [phone]        Medication List    TAKE these medications        COMFORT FIT MATERNITY SUPP MED Misc  1 Device by Does not apply route as needed.     metroNIDAZOLE 500 MG tablet  Commonly known as:  FLAGYL  Take 1 tablet (500 mg total) by mouth 2 (two) times daily.     VITAFOL ULTRA 29-0.6-0.4-200 MG Caps  Take 1 tablet by mouth daily at 10 pm.       Dorathy Kinsman, CNM 03/18/2015 4:00 PM

## 2015-03-18 NOTE — MAU Note (Signed)
Pt presents to MAU with complaints of vaginal pressure for a couple of days. Denies any vaginal bleeding or abnormal discharge.

## 2015-03-18 NOTE — Discharge Instructions (Signed)
Braxton Hicks Contractions °Contractions of the uterus can occur throughout pregnancy. Contractions are not always a sign that you are in labor.  °WHAT ARE BRAXTON HICKS CONTRACTIONS?  °Contractions that occur before labor are called Braxton Hicks contractions, or false labor. Toward the end of pregnancy (32-34 weeks), these contractions can develop more often and may become more forceful. This is not true labor because these contractions do not result in opening (dilatation) and thinning of the cervix. They are sometimes difficult to tell apart from true labor because these contractions can be forceful and people have different pain tolerances. You should not feel embarrassed if you go to the hospital with false labor. Sometimes, the only way to tell if you are in true labor is for your health care provider to look for changes in the cervix. °If there are no prenatal problems or other health problems associated with the pregnancy, it is completely safe to be sent home with false labor and await the onset of true labor. °HOW CAN YOU TELL THE DIFFERENCE BETWEEN TRUE AND FALSE LABOR? °False Labor °· The contractions of false labor are usually shorter and not as hard as those of true labor.   °· The contractions are usually irregular.   °· The contractions are often felt in the front of the lower abdomen and in the groin.   °· The contractions may go away when you walk around or change positions while lying down.   °· The contractions get weaker and are shorter lasting as time goes on.   °· The contractions do not usually become progressively stronger, regular, and closer together as with true labor.   °True Labor °· Contractions in true labor last 30-70 seconds, become very regular, usually become more intense, and increase in frequency.   °· The contractions do not go away with walking.   °· The discomfort is usually felt in the top of the uterus and spreads to the lower abdomen and low back.   °· True labor can be  determined by your health care provider with an exam. This will show that the cervix is dilating and getting thinner.   °WHAT TO REMEMBER °· Keep up with your usual exercises and follow other instructions given by your health care provider.   °· Take medicines as directed by your health care provider.   °· Keep your regular prenatal appointments.   °· Eat and drink lightly if you think you are going into labor.   °· If Braxton Hicks contractions are making you uncomfortable:   °¨ Change your position from lying down or resting to walking, or from walking to resting.   °¨ Sit and rest in a tub of warm water.   °¨ Drink 2-3 glasses of water. Dehydration may cause these contractions.   °¨ Do slow and deep breathing several times an hour.   °WHEN SHOULD I SEEK IMMEDIATE MEDICAL CARE? °Seek immediate medical care if: °· Your contractions become stronger, more regular, and closer together.   °· You have fluid leaking or gushing from your vagina.   °· You have a fever.   °· You pass blood-tinged mucus.   °· You have vaginal bleeding.   °· You have continuous abdominal pain.   °· You have low back pain that you never had before.   °· You feel your baby's head pushing down and causing pelvic pressure.   °· Your baby is not moving as much as it used to.   °  °This information is not intended to replace advice given to you by your health care provider. Make sure you discuss any questions you have with your health care   provider. °  °Document Released: 04/03/2005 Document Revised: 04/08/2013 Document Reviewed: 01/13/2013 °Elsevier Interactive Patient Education ©2016 [employer] ° °

## 2015-03-26 ENCOUNTER — Encounter: Payer: Medicaid Other | Admitting: Obstetrics

## 2015-03-30 ENCOUNTER — Ambulatory Visit (INDEPENDENT_AMBULATORY_CARE_PROVIDER_SITE_OTHER): Payer: Medicaid Other | Admitting: Certified Nurse Midwife

## 2015-03-30 VITALS — BP 105/62 | HR 99 | Temp 97.5°F | Wt 193.0 lb

## 2015-03-30 DIAGNOSIS — Z3483 Encounter for supervision of other normal pregnancy, third trimester: Secondary | ICD-10-CM

## 2015-03-30 DIAGNOSIS — O368131 Decreased fetal movements, third trimester, fetus 1: Secondary | ICD-10-CM | POA: Diagnosis not present

## 2015-03-30 LAB — POCT URINALYSIS DIPSTICK
Bilirubin, UA: NEGATIVE
GLUCOSE UA: NEGATIVE
Ketones, UA: NEGATIVE
NITRITE UA: NEGATIVE
RBC UA: NEGATIVE
Spec Grav, UA: 1.015
UROBILINOGEN UA: 4
pH, UA: 7

## 2015-03-30 LAB — OB RESULTS CONSOLE GC/CHLAMYDIA
CHLAMYDIA, DNA PROBE: NEGATIVE
GC PROBE AMP, GENITAL: NEGATIVE

## 2015-03-30 NOTE — Progress Notes (Signed)
Subjective:    Tami RighterDestiny Powers is a 24 y.o. female being seen today for her obstetrical visit. She is at 6676w3d gestation. Patient reports no complaints. Fetal movement: normal.  States that she is having a hard time with the loss of her mother and has not had the desire to be pregnant or deal with the holidays coming up.    Problem List Items Addressed This Visit    None    Visit Diagnoses    Supervision of other normal pregnancy, antepartum, third trimester    -  Primary    Relevant Orders    POCT urinalysis dipstick (Completed)    Strep B DNA probe    SureSwab, Vaginosis/Vaginitis Plus      Patient Active Problem List   Diagnosis Date Noted  . Supervision of other normal pregnancy 12/02/2014   Objective:    BP 105/62 mmHg  Pulse 99  Temp(Src) 97.5 F (36.4 C)  Wt 193 lb (87.544 kg) FHT:  130 BPM  Uterine Size: size equals dates  Presentation: cephalic   NST: cat. 1 tracing, + accels, no decels, no contractions, moderate variability  cervix: long, thick, closed and posterior.  Assessment:    Pregnancy @ 8676w3d weeks   Depression d/t recent loss  Reactive NST  Plan:   [facility]   labs reviewed, problem list updated Consent signed. GBS sent TDAP offered  Rhogam given for RH negative Pediatrician: discussed. Infant feeding: plans to breastfeed. Maternity leave: discussed. Cigarette smoking: never smoked. Orders Placed This Encounter  Procedures  . Strep B DNA probe  . SureSwab, Vaginosis/Vaginitis Plus  . POCT urinalysis dipstick   No orders of the defined types were placed in this encounter.   Follow up in 1 Week.

## 2015-03-31 ENCOUNTER — Telehealth: Payer: Self-pay

## 2015-03-31 LAB — STREP B DNA PROBE: STREP GROUP B AG: NOT DETECTED

## 2015-03-31 NOTE — Telephone Encounter (Signed)
POKE WITH CHARLEE AT [facility] THIS AM, THEY WILL FOLLOW UP WITH PATIENT TODAY, 12/14 -

## 2015-04-03 LAB — SURESWAB, VAGINOSIS/VAGINITIS PLUS
Atopobium vaginae: NOT DETECTED Log (cells/mL)
BV CATEGORY: UNDETERMINED — AB
C. GLABRATA, DNA: NOT DETECTED
C. TROPICALIS, DNA: NOT DETECTED
C. albicans, DNA: NOT DETECTED
C. parapsilosis, DNA: NOT DETECTED
C. trachomatis RNA, TMA: NOT DETECTED
Gardnerella vaginalis: 6.9 Log (cells/mL)
LACTOBACILLUS SPECIES: 6.7 Log (cells/mL)
MEGASPHAERA SPECIES: NOT DETECTED Log (cells/mL)
N. GONORRHOEAE RNA, TMA: NOT DETECTED
T. vaginalis RNA, QL TMA: NOT DETECTED

## 2015-04-04 ENCOUNTER — Inpatient Hospital Stay (HOSPITAL_COMMUNITY)
Admission: AD | Admit: 2015-04-04 | Discharge: 2015-04-04 | Disposition: A | Discharge status: Home / Self Care | Payer: Medicaid Other | Source: Ambulatory Visit | Attending: Obstetrics | Admitting: Obstetrics

## 2015-04-04 ENCOUNTER — Encounter (HOSPITAL_COMMUNITY): Payer: Self-pay | Admitting: *Deleted

## 2015-04-04 DIAGNOSIS — Z3A35 35 weeks gestation of pregnancy: Secondary | ICD-10-CM | POA: Insufficient documentation

## 2015-04-04 DIAGNOSIS — O368131 Decreased fetal movements, third trimester, fetus 1: Secondary | ICD-10-CM

## 2015-04-04 DIAGNOSIS — O368130 Decreased fetal movements, third trimester, not applicable or unspecified: Secondary | ICD-10-CM

## 2015-04-04 DIAGNOSIS — W1839XA Other fall on same level, initial encounter: Secondary | ICD-10-CM | POA: Diagnosis not present

## 2015-04-04 DIAGNOSIS — Z3689 Encounter for other specified antenatal screening: Secondary | ICD-10-CM

## 2015-04-04 DIAGNOSIS — W1809XA Striking against other object with subsequent fall, initial encounter: Secondary | ICD-10-CM

## 2015-04-04 NOTE — MAU Provider Note (Signed)
History     CSN: [id number]  Arrival date and time: 04/04/15 0016   None     Chief Complaint  Patient presents with  . Decreased Fetal Movement  . Abdominal Pain  . Nausea   HPI 24 y.o. G2P1001 at 8140w1d here s/p fall. Patient states she was deflating an air mattress around 5 PM tonight, fell from hands and knees onto abdomen on hardwood floor. Reports no fetal movement since fall. + abdominal pain, no bleeding or LOF.   Past Medical History  Diagnosis Date  . Anxiety     Past Surgical History  Procedure Laterality Date  . Wisdom tooth extraction      Family History  Problem Relation Age of Onset  . Hypertension Mother   . Hypertension Maternal Grandmother     Social History  Substance Use Topics  . Smoking status: Never Smoker   . Smokeless tobacco: Never Used  . Alcohol Use: No    Allergies: No Known Allergies  Prescriptions prior to admission  Medication Sig Dispense Refill Last Dose  . Elastic Bandages & Supports (COMFORT FIT MATERNITY SUPP MED) MISC 1 Device by Does not apply route as needed. (Patient not taking: Reported on 03/30/2015) 1 each 0 Not Taking  . Prenat-Fe Poly-Methfol-FA-DHA (VITAFOL ULTRA) 29-0.6-0.4-200 MG CAPS Take 1 tablet by mouth daily at 10 pm. (Patient not taking: Reported on 03/30/2015) 30 capsule 12 Not Taking    Review of Systems  Constitutional: Negative.   Respiratory: Negative.   Cardiovascular: Negative.   Gastrointestinal: Positive for abdominal pain (low abdomen, pelvic area). Negative for nausea, vomiting, diarrhea and constipation.  Genitourinary: Negative for dysuria, urgency, frequency, hematuria and flank pain.       Negative for vaginal bleeding, cramping/contractions  Musculoskeletal: Negative.   Neurological: Negative.   Psychiatric/Behavioral: Negative.    Physical Exam   Blood pressure 128/88, pulse 122, temperature 98.2 F (36.8 C), temperature source Oral, resp. rate 18.  Physical Exam  Vitals  reviewed. Constitutional: She is oriented to person, place, and time. She appears well-developed and well-nourished. No distress.  Cardiovascular: Normal rate.   Respiratory: Effort normal.  GI: Soft. Distention: c/w dates. There is no tenderness. There is no rebound and no guarding.  Musculoskeletal: Normal range of motion.  Neurological: She is alert and oriented to person, place, and time.  Skin: Skin is warm and dry.  Psychiatric: Her mood appears anxious.   FHR: 130s bpm, variability: moderate,  accelerations:  Present,  decelerations:  Absent TOCO: quiet  MAU Course  Procedures  12:50 AM - spoke w/ Dr. Gaynell FaceMarshall, rev'd HPI and EFM, would like monitoring x 1 hour, if no change, may d/c to home  FHR reactive throughout, no contractions on TOCO, pt stable throughout  Assessment and Plan   1. Decreased fetal movement, third trimester, fetus 1   2. NST (non-stress test) reactive   3. Fall against object, initial encounter   Rev'd precautions, f/u as scheduled    Medication List    TAKE these medications        COMFORT FIT MATERNITY SUPP MED Misc  1 Device by Does not apply route as needed.     VITAFOL ULTRA 29-0.6-0.4-200 MG Caps  Take 1 tablet by mouth daily at 10 pm.            Follow-up Information    Follow up with HARPER,CHARLES A, MD.   Specialty:  Obstetrics and Gynecology   Why:  as scheduled or sooner as needed  Contact information:   [address] [place] [address] [phone]         Georges Mouse 04/04/2015, 12:40 AM

## 2015-04-04 NOTE — MAU Note (Signed)
Pt stated she was deflaitng an air mattress on her hands and knee and fell on her abd during it  on to a hardwood floor.  This happened around 5pm. Stated she had abd pain the but tried to see if would get better but it has not and know she is very anxious and scared because she has not felt baby move since the incident.

## 2015-04-07 ENCOUNTER — Ambulatory Visit (INDEPENDENT_AMBULATORY_CARE_PROVIDER_SITE_OTHER): Payer: Medicaid Other | Admitting: Certified Nurse Midwife

## 2015-04-07 ENCOUNTER — Other Ambulatory Visit: Payer: Self-pay | Admitting: Certified Nurse Midwife

## 2015-04-07 VITALS — BP 114/68 | HR 118 | Wt 192.0 lb

## 2015-04-07 DIAGNOSIS — N760 Acute vaginitis: Principal | ICD-10-CM

## 2015-04-07 DIAGNOSIS — Z3483 Encounter for supervision of other normal pregnancy, third trimester: Secondary | ICD-10-CM

## 2015-04-07 DIAGNOSIS — B9689 Other specified bacterial agents as the cause of diseases classified elsewhere: Secondary | ICD-10-CM

## 2015-04-07 LAB — POCT URINALYSIS DIPSTICK
BILIRUBIN UA: NEGATIVE
Blood, UA: NEGATIVE
GLUCOSE UA: NEGATIVE
KETONES UA: NEGATIVE
Nitrite, UA: NEGATIVE
Protein, UA: NEGATIVE
Spec Grav, UA: 1.015
Urobilinogen, UA: 1
pH, UA: 7

## 2015-04-07 MED ORDER — METRONIDAZOLE 500 MG PO TABS: 500.0000 mg | ORAL_TABLET | Freq: Two times a day (BID) | ORAL | Status: DC

## 2015-04-07 NOTE — Progress Notes (Signed)
Subjective:    Tami RighterDestiny Powers is a 24 y.o. female being seen today for her obstetrical visit. She is at 3864w4d gestation. Patient reports backache, no bleeding, no cramping, no leaking and occasional contractions. Fetal movement: normal.  Hx of recent fall and was seen in MAU, denies any decreased fetal movement, contractions, or vaginal bleeding.    Problem List Items Addressed This Visit    None    Visit Diagnoses    Encounter for supervision of other normal pregnancy in third trimester    -  Primary      Patient Active Problem List   Diagnosis Date Noted  . Supervision of other normal pregnancy 12/02/2014   Objective:    BP 114/68 mmHg  Pulse 118  Wt 192 lb (87.091 kg) FHT:  135 BPM  Uterine Size: 37 cm and size greater than dates  Presentation: cephalic   Rough EFW: 7#  Assessment:    Pregnancy @ 2364w4d weeks   S>D Plan:    Possible elective induction at 39 weeks for probable LGA, hx of 8#4oz child.     labs reviewed, problem list updated Consent signed. GBS reviewed TDAP offered  Rhogam given for RH negative Pediatrician: discussed. Infant feeding: plans to breastfeed. Maternity leave: discussed. Cigarette smoking: never smoked. No orders of the defined types were placed in this encounter.   No orders of the defined types were placed in this encounter.   Follow up in 1 Week.

## 2015-04-07 NOTE — Progress Notes (Signed)
Had recent fall. [facility]

## 2015-04-13 ENCOUNTER — Telehealth: Payer: Self-pay | Admitting: Certified Nurse Midwife

## 2015-04-13 NOTE — Telephone Encounter (Signed)
Pt called to come to our office. Stated to have vaginal discharge (mocus) pt stated that it has some redness. [facility]. .   Tami Powers

## 2015-04-15 ENCOUNTER — Ambulatory Visit (INDEPENDENT_AMBULATORY_CARE_PROVIDER_SITE_OTHER): Payer: Medicaid Other | Admitting: Certified Nurse Midwife

## 2015-04-15 VITALS — BP 108/68 | HR 108 | Temp 97.9°F | Wt 192.0 lb

## 2015-04-15 DIAGNOSIS — O368131 Decreased fetal movements, third trimester, fetus 1: Secondary | ICD-10-CM | POA: Diagnosis not present

## 2015-04-15 DIAGNOSIS — Z3483 Encounter for supervision of other normal pregnancy, third trimester: Secondary | ICD-10-CM

## 2015-04-15 LAB — POCT URINALYSIS DIPSTICK
Bilirubin, UA: NEGATIVE
Blood, UA: NEGATIVE
GLUCOSE UA: NEGATIVE
Ketones, UA: NEGATIVE
LEUKOCYTES UA: NEGATIVE
NITRITE UA: NEGATIVE
Protein, UA: NEGATIVE
Spec Grav, UA: 1.015
UROBILINOGEN UA: NEGATIVE
pH, UA: 8

## 2015-04-15 NOTE — Progress Notes (Signed)
Subjective:    Tami RighterDestiny Powers is a 24 y.o. female being seen today for her obstetrical visit. She is at 54105w5d gestation. Patient reports backache, no bleeding, no cramping, no leaking and occasional contractions. Fetal movement: decreased.  Problem List Items Addressed This Visit    None    Visit Diagnoses    Supervision of other normal pregnancy, antepartum, third trimester    -  Primary    Relevant Orders    US MFM OB FOLLOW UP      Patient Active Problem List   Diagnosis Date Noted  . Supervision of other normal pregnancy 12/02/2014   Objective:    BP 108/68 mmHg  Pulse 108  Temp(Src) 97.9 F (36.6 C)  Wt 192 lb (87.091 kg) FHT:  135 BPM  Uterine Size: size equals dates  Presentation: cephalic   NST: cat. 1 tracing, + accels, no decels, moderate variability  Cervix: 1cm, long, midline, cephalic  Assessment:    Pregnancy @ 11105w5d weeks   Reported decreased fetal movement  Reactive NST Plan:     labs reviewed, problem list updated Consent signed. GBS results reviewed TDAP offered  Rhogam given for RH negative Pediatrician: discussed. Infant feeding: plans to breastfeed. Maternity leave: discussed. Cigarette smoking: never smoked. Orders Placed This Encounter  Procedures  . US MFM OB FOLLOW UP    Standing Status: Future     Number of Occurrences:      Standing Expiration Date: 06/14/2016    Order Specific Question:  Reason for Exam (SYMPTOM  OR DIAGNOSIS REQUIRED)    Answer:  growth    Order Specific Question:  Preferred imaging location?    Answer:  [facility]   No orders of the defined types were placed in this encounter.   Follow up in 1 Week.

## 2015-04-15 NOTE — Addendum Note (Signed)
Addended by: Henriette CombsHATTON, Adalia Pettis L on: 04/15/2015 05:15 PM   Modules accepted: Orders

## 2015-04-18 NOTE — L&D Delivery Note (Signed)
Delivery Note This is a 25 year old G 2 P1 who was admitted for Not in labor. and elective IOL at term. She progressed normally with Cytotec, epidural and a small amount of pitocin to the second stage of labor.  She pushed for 5 min.  At 8:18 am she delivered a viable infant female, cephalic, over an intact perineum.  A nuchal cord   was not identified. Infant placed on maternal abdomen.  Delayed cord clamping was performed for 24 minutes.  Cord double clamped and cut.  Apgar scores were 9 and 9. The placenta delivered spontaneously, shultz, with a 3 vessel cord.  Inspection revealed none. The uterus was firm bleeding stable.  EBL was 250.    Placenta and umbilical artery blood gas were not sent.  There were no complications during the procedure.  Mom and baby skin to skin following delivery. Left in stable condition.  female was delivered via Vaginal, Spontaneous Delivery (Presentation: Left Occiput Anterior).  APGAR: 9, 9; weight  .   Placenta status: , .  Cord:  with the following complications: None.  Cord pH: N/A  Anesthesia: Epidural  Episiotomy:  none Lacerations:  none Suture Repair: none Est. Blood Loss (mL):  250 ml  Mom to postpartum.  Baby to Couplet care / Skin to Skin.  Roe Coombs, CNM 05/07/2015, 9:25 AM

## 2015-04-22 ENCOUNTER — Encounter: Payer: Medicaid Other | Admitting: Certified Nurse Midwife

## 2015-04-26 ENCOUNTER — Ambulatory Visit (HOSPITAL_COMMUNITY): Payer: Medicaid Other

## 2015-04-29 ENCOUNTER — Ambulatory Visit (INDEPENDENT_AMBULATORY_CARE_PROVIDER_SITE_OTHER): Payer: Medicaid Other | Admitting: Certified Nurse Midwife

## 2015-04-29 ENCOUNTER — Other Ambulatory Visit: Payer: Self-pay | Admitting: Certified Nurse Midwife

## 2015-04-29 ENCOUNTER — Ambulatory Visit (HOSPITAL_COMMUNITY)
Admission: RE | Admit: 2015-04-29 | Discharge: 2015-04-29 | Disposition: A | Discharge status: Home / Self Care | Payer: Medicaid Other | Source: Ambulatory Visit | Attending: Certified Nurse Midwife | Admitting: Certified Nurse Midwife

## 2015-04-29 VITALS — BP 99/67 | HR 99 | Wt 196.0 lb

## 2015-04-29 DIAGNOSIS — O360190 Maternal care for anti-D [Rh] antibodies, unspecified trimester, not applicable or unspecified: Secondary | ICD-10-CM | POA: Diagnosis not present

## 2015-04-29 DIAGNOSIS — IMO0002 Reserved for concepts with insufficient information to code with codable children: Secondary | ICD-10-CM

## 2015-04-29 DIAGNOSIS — Z3A38 38 weeks gestation of pregnancy: Secondary | ICD-10-CM

## 2015-04-29 DIAGNOSIS — Z0489 Encounter for examination and observation for other specified reasons: Secondary | ICD-10-CM

## 2015-04-29 DIAGNOSIS — O26843 Uterine size-date discrepancy, third trimester: Secondary | ICD-10-CM

## 2015-04-29 DIAGNOSIS — Z3483 Encounter for supervision of other normal pregnancy, third trimester: Secondary | ICD-10-CM

## 2015-04-29 LAB — POCT URINALYSIS DIPSTICK
Bilirubin, UA: NEGATIVE
GLUCOSE UA: NEGATIVE
Ketones, UA: NEGATIVE
LEUKOCYTES UA: NEGATIVE
NITRITE UA: NEGATIVE
PH UA: 5
PROTEIN UA: NEGATIVE
RBC UA: NEGATIVE
SPEC GRAV UA: 1.025
UROBILINOGEN UA: NEGATIVE

## 2015-04-29 NOTE — Progress Notes (Signed)
Subjective:    Tami RighterDestiny Powers is a 25 y.o. female being seen today for her obstetrical visit. She is at 1978w5d gestation. Patient reports backache, contractions since about Monday, irregular about every 10 minutes, no bleeding, no leaking and occasional contractions. Fetal movement: normal.  History of postpartum hemorrhage.    Problem List Items Addressed This Visit    None    Visit Diagnoses    Encounter for supervision of other normal pregnancy in third trimester    -  Primary    Relevant Orders    POCT urinalysis dipstick (Completed)      Patient Active Problem List   Diagnosis Date Noted  . Supervision of other normal pregnancy 12/02/2014    Objective:    BP 99/67 mmHg  Pulse 99  Wt 196 lb (88.905 kg) FHT: 130 BPM  Uterine Size: size equals dates  Presentations: cephalic  Pelvic Exam:              Dilation: 1cm       Effacement: Long             Station:  -3    Consistency: soft            Position: posterior     Assessment:    Pregnancy @ 8178w5d weeks   Plan:    IOL scheduled for 05/06/15 at midnight.  Patient education given.   Plans for delivery: Vaginal anticipated; labs reviewed; problem list updated Counseling: Consent signed. Infant feeding: plans to breastfeed. Cigarette smoking: never smoked. L&D discussion: symptoms of labor, discussed when to call, discussed what number to call, anesthetic/analgesic options reviewed and delivering clinician:  plans no preference. Postpartum supports and preparation: circumcision discussed and contraception plans discussed.  Follow up in 1 Week.

## 2015-05-04 ENCOUNTER — Telehealth (HOSPITAL_COMMUNITY): Payer: Self-pay | Admitting: *Deleted

## 2015-05-04 NOTE — Telephone Encounter (Signed)
Preadmission screen  

## 2015-05-06 ENCOUNTER — Encounter: Payer: Medicaid Other | Admitting: Certified Nurse Midwife

## 2015-05-06 ENCOUNTER — Inpatient Hospital Stay (HOSPITAL_COMMUNITY)
Admission: RE | Admit: 2015-05-06 | Discharge: 2015-05-08 | DRG: 775 | Disposition: A | Discharge status: Home / Self Care | Payer: Medicaid Other | Source: Ambulatory Visit | Attending: Obstetrics | Admitting: Obstetrics

## 2015-05-06 ENCOUNTER — Encounter (HOSPITAL_COMMUNITY): Payer: Self-pay

## 2015-05-06 DIAGNOSIS — O9081 Anemia of the puerperium: Secondary | ICD-10-CM | POA: Diagnosis not present

## 2015-05-06 DIAGNOSIS — O3663X0 Maternal care for excessive fetal growth, third trimester, not applicable or unspecified: Secondary | ICD-10-CM | POA: Diagnosis present

## 2015-05-06 DIAGNOSIS — Z8249 Family history of ischemic heart disease and other diseases of the circulatory system: Secondary | ICD-10-CM

## 2015-05-06 DIAGNOSIS — D649 Anemia, unspecified: Secondary | ICD-10-CM | POA: Diagnosis not present

## 2015-05-06 DIAGNOSIS — Z3A39 39 weeks gestation of pregnancy: Secondary | ICD-10-CM | POA: Diagnosis not present

## 2015-05-06 LAB — CBC
HEMATOCRIT: 33.9 % — AB (ref 36.0–46.0)
HEMOGLOBIN: 10.7 g/dL — AB (ref 12.0–15.0)
MCH: 24 pg — AB (ref 26.0–34.0)
MCHC: 31.6 g/dL (ref 30.0–36.0)
MCV: 76 fL — AB (ref 78.0–100.0)
Platelets: 221 10*3/uL (ref 150–400)
RBC: 4.46 MIL/uL (ref 3.87–5.11)
RDW: 15.6 % — AB (ref 11.5–15.5)
WBC: 6.4 10*3/uL (ref 4.0–10.5)

## 2015-05-06 LAB — TYPE AND SCREEN
ABO/RH(D): A POS
ANTIBODY SCREEN: NEGATIVE

## 2015-05-06 LAB — ABO/RH: ABO/RH(D): A POS

## 2015-05-06 MED ORDER — PHENYLEPHRINE 40 MCG/ML (10ML) SYRINGE FOR IV PUSH (FOR BLOOD PRESSURE SUPPORT)
80.0000 ug | PREFILLED_SYRINGE | INTRAVENOUS | Status: DC | PRN
  Filled 2015-05-06: qty 20
  Filled 2015-05-06: qty 2

## 2015-05-06 MED ORDER — LORAZEPAM 2 MG/ML IJ SOLN
0.5000 mg | Freq: Once | INTRAMUSCULAR | Status: AC
  Administered 2015-05-06: 0.5 mg via INTRAVENOUS

## 2015-05-06 MED ORDER — FENTANYL 2.5 MCG/ML BUPIVACAINE 1/10 % EPIDURAL INFUSION (WH - ANES)
14.0000 mL/h | INTRAMUSCULAR | Status: DC | PRN
  Administered 2015-05-07 (×2): 14 mL/h via EPIDURAL
  Filled 2015-05-06 (×2): qty 125

## 2015-05-06 MED ORDER — OXYTOCIN BOLUS FROM INFUSION
500.0000 mL | INTRAVENOUS | Status: DC
  Administered 2015-05-07: 500 mL via INTRAVENOUS

## 2015-05-06 MED ORDER — TERBUTALINE SULFATE 1 MG/ML IJ SOLN: 0.2500 mg | Freq: Once | INTRAMUSCULAR | Status: DC | PRN

## 2015-05-06 MED ORDER — CITRIC ACID-SODIUM CITRATE 334-500 MG/5ML PO SOLN: 30.0000 mL | ORAL | Status: DC | PRN

## 2015-05-06 MED ORDER — ONDANSETRON HCL 4 MG/2ML IJ SOLN
4.0000 mg | Freq: Four times a day (QID) | INTRAMUSCULAR | Status: DC | PRN
Start: 2015-05-06 — End: 2015-05-07
  Administered 2015-05-06 – 2015-05-07 (×2): 4 mg via INTRAVENOUS
  Filled 2015-05-06 (×2): qty 2

## 2015-05-06 MED ORDER — MISOPROSTOL 200 MCG PO TABS
50.0000 ug | ORAL_TABLET | ORAL | Status: DC
  Administered 2015-05-06 (×2): 50 ug via ORAL
  Filled 2015-05-06 (×3): qty 0.5

## 2015-05-06 MED ORDER — PROMETHAZINE HCL 25 MG/ML IJ SOLN
25.0000 mg | Freq: Once | INTRAMUSCULAR | Status: AC
Start: 2015-05-06 — End: 2015-05-06
  Administered 2015-05-06: 25 mg via INTRAMUSCULAR
  Filled 2015-05-06: qty 1

## 2015-05-06 MED ORDER — FENTANYL CITRATE (PF) 100 MCG/2ML IJ SOLN
100.0000 ug | INTRAMUSCULAR | Status: DC | PRN
  Administered 2015-05-06: 100 ug via INTRAVENOUS
  Filled 2015-05-06: qty 2

## 2015-05-06 MED ORDER — ZOLPIDEM TARTRATE 5 MG PO TABS: 10.0000 mg | ORAL_TABLET | Freq: Every evening | ORAL | Status: DC | PRN

## 2015-05-06 MED ORDER — LACTATED RINGERS IV SOLN
INTRAVENOUS | Status: DC
  Administered 2015-05-06 – 2015-05-07 (×3): via INTRAVENOUS

## 2015-05-06 MED ORDER — NALBUPHINE HCL 10 MG/ML IJ SOLN
10.0000 mg | Freq: Once | INTRAMUSCULAR | Status: AC
  Administered 2015-05-06: 10 mg via INTRAMUSCULAR
  Filled 2015-05-06: qty 1

## 2015-05-06 MED ORDER — LACTATED RINGERS IV SOLN: 500.0000 mL | INTRAVENOUS | Status: DC | PRN

## 2015-05-06 MED ORDER — OXYCODONE-ACETAMINOPHEN 5-325 MG PO TABS: 2.0000 | ORAL_TABLET | ORAL | Status: DC | PRN

## 2015-05-06 MED ORDER — LIDOCAINE HCL (PF) 1 % IJ SOLN
30.0000 mL | INTRAMUSCULAR | Status: DC | PRN
  Filled 2015-05-06: qty 30

## 2015-05-06 MED ORDER — DIPHENHYDRAMINE HCL 50 MG/ML IJ SOLN
12.5000 mg | INTRAMUSCULAR | Status: DC | PRN
  Administered 2015-05-07: 12.5 mg via INTRAVENOUS
  Filled 2015-05-06: qty 1

## 2015-05-06 MED ORDER — OXYTOCIN 10 UNIT/ML IJ SOLN
2.5000 [IU]/h | INTRAMUSCULAR | Status: DC
  Filled 2015-05-06: qty 4

## 2015-05-06 MED ORDER — LORAZEPAM 2 MG/ML IJ SOLN
0.5000 mg | Freq: Once | INTRAMUSCULAR | Status: DC
  Filled 2015-05-06 (×2): qty 0.25

## 2015-05-06 MED ORDER — EPHEDRINE 5 MG/ML INJ
10.0000 mg | INTRAVENOUS | Status: DC | PRN
  Filled 2015-05-06: qty 2

## 2015-05-06 MED ORDER — NALBUPHINE HCL 10 MG/ML IJ SOLN
10.0000 mg | Freq: Once | INTRAMUSCULAR | Status: AC
  Administered 2015-05-06: 10 mg via INTRAVENOUS
  Filled 2015-05-06: qty 1

## 2015-05-06 MED ORDER — ACETAMINOPHEN 325 MG PO TABS: 650.0000 mg | ORAL_TABLET | ORAL | Status: DC | PRN

## 2015-05-06 MED ORDER — OXYCODONE-ACETAMINOPHEN 5-325 MG PO TABS
1.0000 | ORAL_TABLET | ORAL | Status: DC | PRN
Start: 2015-05-06 — End: 2015-05-07

## 2015-05-06 NOTE — H&P (Signed)
Tami Powers is a 25 y.o. female presenting for elective IOL at term with probable LGA infant by [place]. History OB History    Gravida Para Term Preterm AB TAB SAB Ectopic Multiple Living   Past Medical History  Diagnosis Date  . Anxiety    Past Surgical History  Procedure Laterality Date  . Wisdom tooth extraction     Family History: family history includes Hypertension in her maternal grandmother and mother. Social History:  reports that she has never smoked. She has never used smokeless tobacco. She reports that she does not drink alcohol or use illicit drugs.   Prenatal Transfer Tool  Maternal Diabetes: No Genetic Screening: Normal Maternal Ultrasounds/Referrals: Abnormal:  Findings:   Other: probable LGA Fetal Ultrasounds or other Referrals:  Referred to Materal Fetal Medicine for S>D Maternal Substance Abuse:  No Significant Maternal Medications:  None Significant Maternal Lab Results:  None Other Comments:  Recent loss of her mother this year, spouse is not very supportive  ROS  Dilation: 3 Effacement (%): 50 Station: -2 Exam by:: Gifford Shave RN  Blood pressure 105/66, pulse 85, temperature 98.6 F (37 C), temperature source Oral, resp. rate 20, height  (1.549 m), weight 190 lb (86.183 kg). Exam Physical Exam  Prenatal labs: ABO, Rh: --/--/A POS, A POS (01/19 1015) Antibody: NEG (01/19 1015) Rubella: 1.89 (08/17 1443) RPR: NON REAC (10/27 1011)  HBsAg: NEGATIVE (08/17 1443)  HIV: NONREACTIVE (10/27 1011)  GBS: NOT DETECTED (12/13 1226)   Assessment/Plan: Admit for IOL, probable LGA infant.  Cytotec followed by pitcocin.  IVP pain medications.  Epidural planning.     Roe Coombs, CNM 05/06/2015, 7:52 PM

## 2015-05-07 ENCOUNTER — Inpatient Hospital Stay (HOSPITAL_COMMUNITY): Payer: Medicaid Other | Admitting: Anesthesiology

## 2015-05-07 ENCOUNTER — Encounter (HOSPITAL_COMMUNITY): Payer: Self-pay

## 2015-05-07 LAB — RPR: RPR: NONREACTIVE

## 2015-05-07 MED ORDER — PRENATAL MULTIVITAMIN CH
1.0000 | ORAL_TABLET | Freq: Every day | ORAL | Status: DC
  Administered 2015-05-08: 1 via ORAL
  Filled 2015-05-07 (×2): qty 1

## 2015-05-07 MED ORDER — OXYCODONE-ACETAMINOPHEN 5-325 MG PO TABS
1.0000 | ORAL_TABLET | ORAL | Status: DC | PRN
  Administered 2015-05-07: 1 via ORAL
  Filled 2015-05-07: qty 1

## 2015-05-07 MED ORDER — FLEET ENEMA 7-19 GM/118ML RE ENEM: 1.0000 | ENEMA | Freq: Every day | RECTAL | Status: DC | PRN

## 2015-05-07 MED ORDER — OXYCODONE-ACETAMINOPHEN 5-325 MG PO TABS
2.0000 | ORAL_TABLET | ORAL | Status: DC | PRN
  Administered 2015-05-07 – 2015-05-08 (×2): 2 via ORAL
  Filled 2015-05-07 (×2): qty 2

## 2015-05-07 MED ORDER — DIBUCAINE 1 % RE OINT: 1.0000 "application " | TOPICAL_OINTMENT | RECTAL | Status: DC | PRN

## 2015-05-07 MED ORDER — ONDANSETRON HCL 4 MG/2ML IJ SOLN: 4.0000 mg | INTRAMUSCULAR | Status: DC | PRN

## 2015-05-07 MED ORDER — ACETAMINOPHEN 325 MG PO TABS: 650.0000 mg | ORAL_TABLET | ORAL | Status: DC | PRN

## 2015-05-07 MED ORDER — ZOLPIDEM TARTRATE 5 MG PO TABS: 5.0000 mg | ORAL_TABLET | Freq: Every evening | ORAL | Status: DC | PRN

## 2015-05-07 MED ORDER — WITCH HAZEL-GLYCERIN EX PADS: 1.0000 "application " | MEDICATED_PAD | CUTANEOUS | Status: DC | PRN

## 2015-05-07 MED ORDER — SIMETHICONE 80 MG PO CHEW: 80.0000 mg | CHEWABLE_TABLET | ORAL | Status: DC | PRN

## 2015-05-07 MED ORDER — TERBUTALINE SULFATE 1 MG/ML IJ SOLN
0.2500 mg | Freq: Once | INTRAMUSCULAR | Status: DC | PRN
  Filled 2015-05-07: qty 1

## 2015-05-07 MED ORDER — LIDOCAINE HCL (PF) 1 % IJ SOLN
INTRAMUSCULAR | Status: DC | PRN
  Administered 2015-05-07 (×2): 8 mL via EPIDURAL

## 2015-05-07 MED ORDER — FERROUS SULFATE 325 (65 FE) MG PO TABS
325.0000 mg | ORAL_TABLET | Freq: Two times a day (BID) | ORAL | Status: DC
  Administered 2015-05-07 – 2015-05-08 (×2): 325 mg via ORAL
  Filled 2015-05-07 (×2): qty 1

## 2015-05-07 MED ORDER — CALCIUM CARBONATE ANTACID 500 MG PO CHEW: 1.0000 | CHEWABLE_TABLET | Freq: Every day | ORAL | Status: DC | PRN

## 2015-05-07 MED ORDER — IBUPROFEN 600 MG PO TABS
600.0000 mg | ORAL_TABLET | Freq: Four times a day (QID) | ORAL | Status: DC
  Administered 2015-05-07 – 2015-05-08 (×5): 600 mg via ORAL
  Filled 2015-05-07 (×5): qty 1

## 2015-05-07 MED ORDER — SENNOSIDES-DOCUSATE SODIUM 8.6-50 MG PO TABS
2.0000 | ORAL_TABLET | ORAL | Status: DC
Start: 2015-05-08 — End: 2015-05-08
  Administered 2015-05-07: 2 via ORAL
  Filled 2015-05-07: qty 2

## 2015-05-07 MED ORDER — ONDANSETRON HCL 4 MG PO TABS: 4.0000 mg | ORAL_TABLET | ORAL | Status: DC | PRN

## 2015-05-07 MED ORDER — METHYLERGONOVINE MALEATE 0.2 MG PO TABS: 0.2000 mg | ORAL_TABLET | ORAL | Status: DC | PRN

## 2015-05-07 MED ORDER — BENZOCAINE-MENTHOL 20-0.5 % EX AERO: 1.0000 "application " | INHALATION_SPRAY | CUTANEOUS | Status: DC | PRN

## 2015-05-07 MED ORDER — BISACODYL 10 MG RE SUPP: 10.0000 mg | Freq: Every day | RECTAL | Status: DC | PRN

## 2015-05-07 MED ORDER — OXYTOCIN 10 UNIT/ML IJ SOLN
1.0000 m[IU]/min | INTRAVENOUS | Status: DC
  Administered 2015-05-07: 2 m[IU]/min via INTRAVENOUS

## 2015-05-07 MED ORDER — MEDROXYPROGESTERONE ACETATE 150 MG/ML IM SUSP: 150.0000 mg | INTRAMUSCULAR | Status: DC | PRN

## 2015-05-07 MED ORDER — MEASLES, MUMPS & RUBELLA VAC ~~LOC~~ INJ
0.5000 mL | INJECTION | Freq: Once | SUBCUTANEOUS | Status: DC
  Filled 2015-05-07: qty 0.5

## 2015-05-07 MED ORDER — DIPHENHYDRAMINE HCL 25 MG PO CAPS: 25.0000 mg | ORAL_CAPSULE | Freq: Four times a day (QID) | ORAL | Status: DC | PRN

## 2015-05-07 MED ORDER — LANOLIN HYDROUS EX OINT: TOPICAL_OINTMENT | CUTANEOUS | Status: DC | PRN

## 2015-05-07 MED ORDER — OXYTOCIN 10 UNIT/ML IJ SOLN: 2.5000 [IU]/h | INTRAVENOUS | Status: DC | PRN

## 2015-05-07 MED ORDER — METHYLERGONOVINE MALEATE 0.2 MG/ML IJ SOLN: 0.2000 mg | INTRAMUSCULAR | Status: DC | PRN

## 2015-05-07 MED ORDER — TETANUS-DIPHTH-ACELL PERTUSSIS 5-2.5-18.5 LF-MCG/0.5 IM SUSP: 0.5000 mL | Freq: Once | INTRAMUSCULAR | Status: DC

## 2015-05-07 NOTE — Anesthesia Procedure Notes (Signed)
Epidural Patient location during procedure: OB Start time: 05/07/2015 12:15 AM End time: 05/07/2015 12:19 AM  Staffing Anesthesiologist: Leilani Able Performed by: anesthesiologist   Preanesthetic Checklist Completed: patient identified, surgical consent, pre-op evaluation, timeout performed, IV checked, risks and benefits discussed and monitors and equipment checked  Epidural Patient position: sitting Prep: site prepped and draped and DuraPrep Patient monitoring: continuous pulse ox and blood pressure Approach: midline Location: L3-L4 Injection technique: LOR air  Needle:  Needle type: Tuohy  Needle gauge: 17 G Needle length: 9 cm and 9 Needle insertion depth: 6 cm Catheter type: closed end flexible Catheter size: 19 Gauge Catheter at skin depth: 12 cm Test dose: negative and Other  Assessment Sensory level: T10 Events: blood not aspirated, injection not painful, no injection resistance, negative IV test and no paresthesia  Additional Notes Reason for block:procedure for pain

## 2015-05-07 NOTE — Progress Notes (Signed)
I was given a referral in Safety Rounds to see Ms Tami Powers because she had many stressors right now.  I spoke with her nurse and determined that today she is exhausted and would not benefit from talking at this time.  If she would like to talk over the weekend, please page Spiritual Care at [phone].  Chaplain Dyanne Carrel, Bcc Pager, [phone] 2:09 PM    05/07/15 1400  Clinical Encounter Type  Visited With Health care provider

## 2015-05-07 NOTE — Lactation Note (Signed)
This note was copied from the chart of [name]. Lactation Consultation Note  Patient Name: [name] Today's Date: 05/07/2015 Reason for consult: Initial assessment Mom reports baby has latched few times since delivery this am. Baby asleep at this visit. Mom BF her 1st baby for 2 months but reports baby was not satisfied at the breast, "I did not make enough milk for her". Mom asking family member to purchase pacifier for baby to use. Cautioned Mom about pacifier use this early with breastfeeding. Basic teaching reviewed with Mom. Encouraged to BF with feeding ques. Lactation brochure left for review, advised of OP services and support group. Encouraged to call for assist as desired.   Maternal Data Has patient been taught Hand Expression?: Yes Does the patient have breastfeeding experience prior to this delivery?: Yes  Feeding Feeding Type: Breast Fed Length of feed: 56 min  LATCH Score/Interventions                      Lactation Tools Discussed/Used WIC Program: No   Consult Status Consult Status: Follow-up Date: 05/08/15 Follow-up type: In-patient    Alfred Levins 05/07/2015, 3:12 PM

## 2015-05-07 NOTE — Anesthesia Preprocedure Evaluation (Signed)
Anesthesia Evaluation  Patient identified by MRN, date of birth, ID band Patient awake    Reviewed: Allergy & Precautions, H&P , NPO status , Patient's Chart, lab work & pertinent test results  Airway Mallampati: II  TM Distance: >3 FB Neck ROM: full    Dental no notable dental hx.    Pulmonary neg pulmonary ROS,    Pulmonary exam normal       Cardiovascular negative cardio ROS Normal cardiovascular exam    Neuro/Psych negative neurological ROS     GI/Hepatic negative GI ROS, Neg liver ROS,   Endo/Other  negative endocrine ROS  Renal/GU negative Renal ROS     Musculoskeletal   Abdominal (+) + obese,   Peds  Hematology negative hematology ROS (+)   Anesthesia Other Findings   Reproductive/Obstetrics (+) Pregnancy                             Anesthesia Physical Anesthesia Plan  ASA: II  Anesthesia Plan: Epidural   Post-op Pain Management:    Induction:   Airway Management Planned:   Additional Equipment:   Intra-op Plan:   Post-operative Plan:   Informed Consent: I have reviewed the patients History and Physical, chart, labs and discussed the procedure including the risks, benefits and alternatives for the proposed anesthesia with the patient or authorized representative who has indicated his/her understanding and acceptance.     Plan Discussed with:   Anesthesia Plan Comments:         Anesthesia Quick Evaluation  

## 2015-05-07 NOTE — Anesthesia Postprocedure Evaluation (Signed)
Anesthesia Post Note  Patient: Tami Powers  Procedure(s) Performed: * No procedures listed *  Patient location during evaluation: Mother Baby Anesthesia Type: Epidural Level of consciousness: awake and alert and oriented Pain management: pain level controlled Vital Signs Assessment: post-procedure vital signs reviewed and stable Respiratory status: spontaneous breathing and respiratory function stable Cardiovascular status: blood pressure returned to baseline Postop Assessment: no headache, no backache, patient able to bend at knees, no signs of nausea or vomiting and adequate PO intake Anesthetic complications: no    Last Vitals:  Filed Vitals:   05/07/15 1050 05/07/15 1144  BP: 117/59 106/54  Pulse: 94 82  Temp: 36.9 C 36.9 C  Resp: 18 18    Last Pain:  Filed Vitals:   05/07/15 1344  PainSc: 7                  Delberta Folts

## 2015-05-08 ENCOUNTER — Other Ambulatory Visit: Payer: Self-pay | Admitting: Certified Nurse Midwife

## 2015-05-08 LAB — CBC
HEMATOCRIT: 28.7 % — AB (ref 36.0–46.0)
HEMOGLOBIN: 8.9 g/dL — AB (ref 12.0–15.0)
MCH: 23.6 pg — ABNORMAL LOW (ref 26.0–34.0)
MCHC: 31 g/dL (ref 30.0–36.0)
MCV: 76.1 fL — AB (ref 78.0–100.0)
PLATELETS: 181 10*3/uL (ref 150–400)
RBC: 3.77 MIL/uL — AB (ref 3.87–5.11)
RDW: 15.5 % (ref 11.5–15.5)
WBC: 10 10*3/uL (ref 4.0–10.5)

## 2015-05-08 MED ORDER — FUSION PLUS PO CAPS: 1.0000 | ORAL_CAPSULE | Freq: Every day | ORAL | Status: AC

## 2015-05-08 MED ORDER — IBUPROFEN 800 MG PO TABS: 800.0000 mg | ORAL_TABLET | Freq: Three times a day (TID) | ORAL | Status: DC | PRN

## 2015-05-08 NOTE — Progress Notes (Signed)
CLINICAL SOCIAL WORK MATERNAL/CHILD NOTE  Patient Details  Name: Tami Powers MRN: [id number] Date of Birth: 05/07/2013  Date: 05/08/2015  Clinical Social Worker Initiating Note: Cumi Bevel, LCSWDate/ Time Initiated: 05/08/15/1130   Child's Name: Tami Powers   Legal Guardian:  (Parents Brandon and Duchess Powers)   Need for Interpreter: None   Date of Referral: 05/07/15   Reason for Referral: Other (Comment)   Referral Source: [facility]   Address: [address] [place], [place] [address]  Phone number:  ([phone])   Household Members: Spouse, Minor Children   Natural Supports (not living in the home): Extended Family, Immediate Family   Professional Supports:None   Employment:Self-employed (Both parents self employed)   Type of Work:     Education:     Financial Resources:Medicaid   Other Resources:     Cultural/Religious Considerations Which May Impact Care: none noted  Strengths: Ability to meet basic needs , Home prepared for child    Risk Factors/Current Problems: None   Cognitive State: Alert , Able to Concentrate    Mood/Affect: Happy    CSW Assessment: Acknowledged order for social work consult to assess mother's hx of anxiety. Met with mother who was pleasant and receptive to social work. She is married with one other dependent. MOB reports hx of anxiety noting that her mother died last year during her pregnancy and her younger brother now lives with her. Informed that she is not taking any medication. "I have been able to cope without medication, but I was extremely anxious during the delivery as I was thinking about my mother and was given xanax". She denies any SI, HI, or current symptoms of depression or anxiety. She denies any use of alcohol or illicit drug use during pregnancy. Mother states that she has a good support system and believes that she will be able to continue to cope with  the anxiety using self calming techniques. No acute social concerns noted or reported at this time. Affect and behavior was appropriate during the entire visit. Mother informed of social work availability.  CSW Plan/Description:    Mother aware of signs/symptoms of PP Depression and available resources No further intervention required No barriers to discharge   Bevel, Cumi J, LCSW 05/08/2015, 3:30 PM  

## 2015-05-08 NOTE — Progress Notes (Signed)
Post Partum Day 1 Subjective: no complaints  Objective: Blood pressure 97/46, pulse 79, temperature 97.3 F (36.3 C), temperature source Oral, resp. rate 16, height  (1.549 m), weight 190 lb (86.183 kg), SpO2 96 %, unknown if currently breastfeeding.  Physical Exam:  General: alert and no distress Lochia: appropriate Uterine Fundus: firm Incision: none DVT Evaluation: No evidence of DVT seen on physical exam.   Recent Labs  05/06/15 1015 05/08/15 0520  HGB 10.7* 8.9*  HCT 33.9* 28.7*    Assessment/Plan:   Anemia.  Clinically stable.  Iron Rx. Plan for discharge tomorrow   LOS: 2 days   Kwynn Schlotter A 05/08/2015, 6:48 AM

## 2015-05-08 NOTE — Discharge Summary (Signed)
Obstetric Discharge Summary Reason for Admission: induction of labor Prenatal Procedures: ultrasound Intrapartum Procedures: spontaneous vaginal delivery Postpartum Procedures: none Complications-Operative and Postpartum: none HEMOGLOBIN  Date Value Ref Range Status  05/08/2015 8.9* 12.0 - 15.0 g/dL Final   HCT  Date Value Ref Range Status  05/08/2015 28.7* 36.0 - 46.0 % Final    Physical Exam:  General: alert and no distress Lochia: appropriate Uterine Fundus: firm Incision: none DVT Evaluation: No evidence of DVT seen on physical exam.  Discharge Diagnoses: Term Pregnancy-delivered  Discharge Information: Date: 05/08/2015 Activity: pelvic rest Diet: routine Medications: PNV, Ibuprofen, Colace and Iron Condition: stable Instructions: refer to practice specific booklet Discharge to: home Follow-up Information    Follow up with Roe Coombs, CNM In 2 weeks.   Specialty:  Certified Nurse Midwife   Contact information:   [address] [place] [address] [phone]       Newborn Data: Live born female  Birth Weight: 9 lb 6.1 oz (4255 g) APGAR: 9, 9  Home with mother.  HARPER,CHARLES A 05/08/2015, 9:20 AM

## 2015-05-21 ENCOUNTER — Ambulatory Visit: Payer: Medicaid Other | Admitting: Certified Nurse Midwife

## 2015-05-26 NOTE — Progress Notes (Signed)
Post discharge chart review completed.  

## 2015-06-11 ENCOUNTER — Telehealth: Payer: Self-pay | Admitting: Obstetrics

## 2015-06-17 NOTE — Telephone Encounter (Signed)
CLOSE ENCOUNTER °

## 2016-08-04 ENCOUNTER — Encounter (HOSPITAL_COMMUNITY): Payer: Self-pay

## 2016-08-04 ENCOUNTER — Emergency Department (HOSPITAL_COMMUNITY)
Admission: EM | Admit: 2016-08-04 | Discharge: 2016-08-04 | Disposition: A | Discharge status: Home / Self Care | Payer: Medicaid Other | Attending: Emergency Medicine | Admitting: Emergency Medicine

## 2016-08-04 DIAGNOSIS — M25552 Pain in left hip: Secondary | ICD-10-CM

## 2016-08-04 DIAGNOSIS — M79605 Pain in left leg: Secondary | ICD-10-CM

## 2016-08-04 DIAGNOSIS — M7632 Iliotibial band syndrome, left leg: Secondary | ICD-10-CM

## 2016-08-04 MED ORDER — MELOXICAM 15 MG PO TABS: 15.0000 mg | ORAL_TABLET | Freq: Every day | ORAL | 0 refills | Status: DC

## 2016-08-04 NOTE — ED Triage Notes (Addendum)
Pt report intermittent leg pain since Thanksgiving. Today, she is reporting pain in her left leg from her thigh radiating down to her foot. She states that when this happens, she also gets bruising with unknown sources a few days later. Pt states that the pain is an aching pain. She denies any injury. A&Ox4.

## 2016-08-04 NOTE — ED Provider Notes (Signed)
[facility]-EMERGENCY DEPT Provider Note   CSN: [id number] Arrival date & time: 08/04/16  1715  By signing my name below, I, Marnette Burgess Long, attest that this documentation has been prepared under the direction and in the presence of [address] [employer]. Electronically Signed: Marnette Burgess Long, Scribe. 08/04/2016. 5:59 PM.  History   Chief Complaint Chief Complaint  Patient presents with  . Leg Pain    Left   The history is provided by the patient and medical records. No language interpreter was used.  Leg Pain   This is a recurrent problem. The current episode started more than 1 week ago. The problem occurs daily. The problem has not changed since onset.The pain is present in the left hip, left upper leg and left knee. The quality of the pain is described as aching. The pain is at a severity of 7/10. The pain is moderate. Pertinent negatives include no numbness, full range of motion and no tingling. The symptoms are aggravated by standing and activity. She has tried nothing for the symptoms. The treatment provided no relief. There has been no history of extremity trauma.    HPI Comments:  Tami Powers is a 26 y.o. female with a PMHx of Anxiety, who presents to the Emergency Department complaining of intermittent L hip/thigh/leg pain onset five months ago. Pt reports intermittent but ongoing L leg pain followed by intermittent bruising to the L anterior thighs without known trauma or injury. She has difficulty weight bearing d/t pain. Denies any change in activities or known inciting event for her symptoms. Reports she currently doesn't have any bruising. States she had a PCP appt recently and they did labs and was told she needed B12 shots but other than that she wasn't told anything else that was abnormal. She has an appt with them next week. She states the pain is 7/10, intermittent but currently constant, aching L hip pain, that radiates from her hip to her lateral thigh and down into her  lateral L knee, exacerbated by ambulation/standing. She did not try anything for relief of her pain or symptoms. No known hx of sciatica or other issues with her back/hips/knees. She denies fevers, chills, gum bleeding, current ongoing bruising, CP, SOB, abd pain, N/V/D/C, hematuria, dysuria, numbness, tingling, focal weakness, or any other complaints at this time. Denies being seated for prolonged periods of time. Not a current runner, but ran in highschool. Has a 36 month old child at home.    Past Medical History:  Diagnosis Date  . Anxiety    Patient Active Problem List   Diagnosis Date Noted  . NSVD (normal spontaneous vaginal delivery) 05/07/2015  . Indication for care in labor or delivery 05/06/2015  . Supervision of other normal pregnancy 12/02/2014    Past Surgical History:  Procedure Laterality Date  . WISDOM TOOTH EXTRACTION     OB History    Gravida Para Term Preterm AB Living   SAB TAB Ectopic Multiple Live Births         0 2     Home Medications    Prior to Admission medications   Medication Sig Start Date End Date Taking? Authorizing Provider  calcium carbonate (TUMS - DOSED IN MG ELEMENTAL CALCIUM) 500 MG chewable tablet Chew 1 tablet by mouth daily as needed for indigestion or heartburn.    Historical Provider, MD  ibuprofen (ADVIL,MOTRIN) 800 MG tablet Take 1 tablet (800 mg total) by mouth every  8 (eight) hours as needed. 05/08/15   Brock Bad, MD  Iron-FA-B Cmp-C-Biot-Probiotic (FUSION PLUS) CAPS Take 1 capsule by mouth daily before breakfast. 05/08/15   Brock Bad, MD   Family History Family History  Problem Relation Age of Onset  . Hypertension Mother   . Hypertension Maternal Grandmother    Social History Social History  Substance Use Topics  . Smoking status: Never Smoker  . Smokeless tobacco: Never Used  . Alcohol use No   Allergies   Patient has no known allergies.   Review of Systems Review of Systems    Constitutional: Negative for chills and fever.  HENT: Negative for dental problem (no gum bleeding).   Respiratory: Negative for shortness of breath.   Cardiovascular: Negative for chest pain.  Gastrointestinal: Negative for abdominal pain, constipation, diarrhea, nausea and vomiting.  Genitourinary: Negative for dysuria and hematuria.  Musculoskeletal: Positive for arthralgias and myalgias. Negative for joint swelling.  Skin: Negative for color change (intermittent bruising; none present now).  Allergic/Immunologic: Negative for immunocompromised state.  Neurological: Negative for tingling, weakness and numbness.  Psychiatric/Behavioral: Negative for confusion.   All systems reviewed and are negative for acute change except as noted in the HPI.    Physical Exam Updated Vital Signs BP 125/80 (BP Location: Left Arm)   Pulse 97   Temp 98 F (36.7 C) (Oral)   Resp 12   Ht 5' 1.5" (1.562 m)   Wt 179 lb 9 oz (81.4 kg)   SpO2 97%   BMI 33.38 kg/m   Physical Exam  Constitutional: She is oriented to person, place, and time. Vital signs are normal. She appears well-developed and well-nourished.  Non-toxic appearance. No distress.  Afebrile, nontoxic, NAD  HENT:  Head: Normocephalic and atraumatic.  Mouth/Throat: Mucous membranes are normal.  Eyes: Conjunctivae and EOM are normal. Right eye exhibits no discharge. Left eye exhibits no discharge.  Neck: Normal range of motion. Neck supple.  Cardiovascular: Normal rate and intact distal pulses.   Pulmonary/Chest: Effort normal. No respiratory distress.  Abdominal: Normal appearance. She exhibits no distension.  Musculoskeletal: Normal range of motion.       Left hip: She exhibits tenderness. She exhibits normal range of motion, normal strength, no bony tenderness, no swelling, no crepitus, no deformity and no laceration.       Lumbar back: Normal.  L hip with FROM intact, with no focal bony or joint line TTP, but with diffuse gluteal  and TFL muscular TTP extending down the IT band which is tightened, L knee with FROM intact and no focal bony or jointline TTP, no bruising or swelling, no crepitus or deformity, no limb length discrepancy or abnormal rotation. No pain with log roll testing. Strength and sensation grossly intact, distal pulses intact, compartments soft. Gait steady. All spinal levels non TTP with no bony stepoffs or deformities.   Neurological: She is alert and oriented to person, place, and time. She has normal strength. No sensory deficit.  Skin: Skin is warm, dry and intact. No rash noted.  No bruising noted to all exposed surfaces.   Psychiatric: She has a normal mood and affect. Her behavior is normal.  Nursing note and vitals reviewed.    ED Treatments / Results  DIAGNOSTIC STUDIES:  Oxygen Saturation is 97% on RA, normal by my interpretation.    COORDINATION OF CARE:  5:58 PM Discussed treatment plan with pt at bedside including pain medication and Mobic and pt agreed to plan.  Labs (  all labs ordered are listed, but only abnormal results are displayed) Labs Reviewed - No data to display  EKG  EKG Interpretation None       Radiology No results found.  Procedures Procedures (including critical care time)  Medications Ordered in ED Medications - No data to display   Initial Impression / Assessment and Plan / ED Course  I have reviewed the triage vital signs and the nursing notes.  Pertinent labs & imaging results that were available during my care of the patient were reviewed by me and considered in my medical decision making (see chart for details).     26 y.o. female here with intermittent ongoing L hip pain x5-6 months. Radiates down lateral thigh. No injury or trauma. States occasional bruising on the thighs, however none present. Labs from last year reveal mild anemia but plt normal. States she had recent labs at her PCPs and wasn't told she had any issue aside from needing B12.  Has f/up appt with them next week. On exam, mild tenderness to glutes and TFL, down IT band, which is fairly tight. No focal joint line or bony TTP. FROM, no abnormal rotation, NVI with soft compartments, and strength intact. Pt declined toradol IM here. Doubt need for labs/imaging, could be IT band syndrome secondary to ?hip impingement/labrum tear. Will start on mobic, and discussed OTC remedies for symptom relief, and foam roller/heat use. F/up with ortho in 1-2wks for recheck and ongoing evaluation/management of hip pain. F/up with PCP in 1wk for recheck of labs, but no s/sx of significant bleeding/bruising or coagulopathy, doubt need for emergent work up today. I explained the diagnosis and have given explicit precautions to return to the ER including for any other new or worsening symptoms. The patient understands and accepts the medical plan as it's been dictated and I have answered their questions. Discharge instructions concerning home care and prescriptions have been given. The patient is STABLE and is discharged to home in good condition.   I personally performed the services described in this documentation, which was scribed in my presence. The recorded information has been reviewed and is accurate.   Final Clinical Impressions(s) / ED Diagnoses   Final diagnoses:  Left hip pain  It band syndrome, left  Left leg pain    New Prescriptions New Prescriptions   MELOXICAM (MOBIC) 15 MG TABLET    Take 1 tablet (15 mg total) by mouth daily. TAKE WITH [address] PA-C 08/04/16 1817    Shaune Pollack, MD 08/05/16 1150    Shaune Pollack, MD 08/05/16 [phone]

## 2016-08-04 NOTE — Discharge Instructions (Signed)
Use a foam roller to help massage the IT band. Use heat to the area of soreness throughout the day, using a heat pack for no more than 20 minutes every hour. Use mobic as directed to help with inflammation and pain, don't take any other NSAIDs like ibuprofen/aleve. Use additional tylenol as needed for additional pain relief. Call orthopedic follow up today or tomorrow to schedule followup appointment for ongoing evaluation and management of your hip and thigh pain. Follow up with your primary care doctor in 1-2 weeks for recheck of your symptoms. Return to the ER for changes or worsening symptoms.

## 2016-08-08 ENCOUNTER — Encounter (HOSPITAL_COMMUNITY): Payer: Self-pay | Admitting: Family Medicine

## 2016-08-08 ENCOUNTER — Ambulatory Visit (HOSPITAL_COMMUNITY)
Admission: EM | Admit: 2016-08-08 | Discharge: 2016-08-08 | Disposition: A | Discharge status: Home / Self Care | Payer: Medicaid Other | Attending: Emergency Medicine | Admitting: Emergency Medicine

## 2016-08-08 DIAGNOSIS — R21 Rash and other nonspecific skin eruption: Secondary | ICD-10-CM | POA: Diagnosis not present

## 2016-08-08 DIAGNOSIS — W57XXXA Bitten or stung by nonvenomous insect and other nonvenomous arthropods, initial encounter: Secondary | ICD-10-CM

## 2016-08-08 MED ORDER — METHYLPREDNISOLONE SODIUM SUCC 125 MG IJ SOLR
INTRAMUSCULAR | Status: AC
  Filled 2016-08-08: qty 2

## 2016-08-08 MED ORDER — DIPHENHYDRAMINE HCL 25 MG PO CAPS
ORAL_CAPSULE | ORAL | Status: AC
  Filled 2016-08-08: qty 1

## 2016-08-08 MED ORDER — DIPHENHYDRAMINE HCL 25 MG PO CAPS
25.0000 mg | ORAL_CAPSULE | Freq: Once | ORAL | Status: AC
  Administered 2016-08-08: 25 mg via ORAL

## 2016-08-08 MED ORDER — METHYLPREDNISOLONE SODIUM SUCC 125 MG IJ SOLR
125.0000 mg | Freq: Once | INTRAMUSCULAR | Status: AC
  Administered 2016-08-08: 125 mg via INTRAMUSCULAR

## 2016-08-08 NOTE — ED Triage Notes (Signed)
Pt  Here for rash to multiple places on her body since yesterday . sts unsure of what is causing it.

## 2016-08-08 NOTE — Discharge Instructions (Signed)
Take OTC benadryl 25 mg po every 6 hours for itching. May also try cool aveeno oatmeal bath. You were given steroid in UC. Follow up with your PCP/dermatologist if symptoms persist. Go to Er for new or worsening issues. Return to UC as needed.

## 2017-04-23 ENCOUNTER — Ambulatory Visit (INDEPENDENT_AMBULATORY_CARE_PROVIDER_SITE_OTHER): Payer: Medicaid Other

## 2017-04-23 ENCOUNTER — Other Ambulatory Visit: Payer: Self-pay | Admitting: Obstetrics and Gynecology

## 2017-04-23 ENCOUNTER — Telehealth: Payer: Self-pay

## 2017-04-23 DIAGNOSIS — F32A Depression, unspecified: Secondary | ICD-10-CM

## 2017-04-23 DIAGNOSIS — N912 Amenorrhea, unspecified: Secondary | ICD-10-CM

## 2017-04-23 DIAGNOSIS — Z3201 Encounter for pregnancy test, result positive: Secondary | ICD-10-CM | POA: Diagnosis not present

## 2017-04-23 DIAGNOSIS — F329 Major depressive disorder, single episode, unspecified: Secondary | ICD-10-CM

## 2017-04-23 DIAGNOSIS — O99342 Other mental disorders complicating pregnancy, second trimester: Principal | ICD-10-CM

## 2017-04-23 LAB — POCT URINE PREGNANCY: PREG TEST UR: POSITIVE — AB

## 2017-04-23 MED ORDER — SERTRALINE HCL 50 MG PO TABS: 50.0000 mg | ORAL_TABLET | Freq: Every day | ORAL | 2 refills | Status: DC

## 2017-04-23 NOTE — Telephone Encounter (Signed)
Pharmacy called wanting clarification on patients zoloft rx. Per Dr. Jolayne Pantheronstant rx should be taken daily. Pharmacy aware

## 2017-04-23 NOTE — Progress Notes (Signed)
Pt presents for Nurse visit only for UPT UPT: POSITIVE  confirmation.pt unsure of LMP.  Pt given prenatal Vitamins.

## 2017-05-22 ENCOUNTER — Encounter: Payer: Medicaid Other | Admitting: Certified Nurse Midwife

## 2019-02-03 ENCOUNTER — Emergency Department (HOSPITAL_COMMUNITY): Payer: Medicaid Other

## 2019-02-03 ENCOUNTER — Encounter (HOSPITAL_COMMUNITY): Payer: Self-pay | Admitting: Emergency Medicine

## 2019-02-03 ENCOUNTER — Other Ambulatory Visit: Payer: Self-pay

## 2019-02-03 ENCOUNTER — Emergency Department (HOSPITAL_COMMUNITY)
Admission: EM | Admit: 2019-02-03 | Discharge: 2019-02-03 | Disposition: A | Discharge status: Home / Self Care | Payer: Medicaid Other | Attending: Emergency Medicine | Admitting: Emergency Medicine

## 2019-02-03 DIAGNOSIS — R109 Unspecified abdominal pain: Secondary | ICD-10-CM | POA: Diagnosis present

## 2019-02-03 DIAGNOSIS — R509 Fever, unspecified: Secondary | ICD-10-CM | POA: Diagnosis not present

## 2019-02-03 DIAGNOSIS — R519 Headache, unspecified: Secondary | ICD-10-CM | POA: Insufficient documentation

## 2019-02-03 DIAGNOSIS — N12 Tubulo-interstitial nephritis, not specified as acute or chronic: Secondary | ICD-10-CM

## 2019-02-03 LAB — URINALYSIS, ROUTINE W REFLEX MICROSCOPIC
Bilirubin Urine: NEGATIVE
Glucose, UA: NEGATIVE mg/dL
Ketones, ur: 20 mg/dL — AB
Nitrite: POSITIVE — AB
Protein, ur: 100 mg/dL — AB
RBC / HPF: >50 RBC/hpf — ABNORMAL HIGH (ref 0–5)
Specific Gravity, Urine: 1.013 (ref 1.005–1.030)
WBC, UA: >50 WBC/hpf — ABNORMAL HIGH (ref 0–5)
pH: 5 (ref 5.0–8.0)

## 2019-02-03 LAB — CBC WITH DIFFERENTIAL/PLATELET
Abs Immature Granulocytes: 0.02 10*3/uL (ref 0.00–0.07)
Basophils Absolute: 0 10*3/uL (ref 0.0–0.1)
Basophils Relative: 0 %
Eosinophils Absolute: 0 10*3/uL (ref 0.0–0.5)
Eosinophils Relative: 0 %
HCT: 40.8 % (ref 36.0–46.0)
Hemoglobin: 13.4 g/dL (ref 12.0–15.0)
Immature Granulocytes: 0 %
Lymphocytes Relative: 8 %
Lymphs Abs: 0.7 10*3/uL (ref 0.7–4.0)
MCH: 28.6 pg (ref 26.0–34.0)
MCHC: 32.8 g/dL (ref 30.0–36.0)
MCV: 87 fL (ref 80.0–100.0)
Monocytes Absolute: 0.8 10*3/uL (ref 0.1–1.0)
Monocytes Relative: 9 %
Neutro Abs: 7.9 10*3/uL — ABNORMAL HIGH (ref 1.7–7.7)
Neutrophils Relative %: 83 %
Platelets: 151 10*3/uL (ref 150–400)
RBC: 4.69 MIL/uL (ref 3.87–5.11)
RDW: 13.7 % (ref 11.5–15.5)
WBC: 9.4 10*3/uL (ref 4.0–10.5)
nRBC: 0 % (ref 0.0–0.2)

## 2019-02-03 LAB — COMPREHENSIVE METABOLIC PANEL
ALT: 17 U/L (ref 0–44)
AST: 17 U/L (ref 15–41)
Albumin: 4.2 g/dL (ref 3.5–5.0)
Alkaline Phosphatase: 53 U/L (ref 38–126)
Anion gap: 10 (ref 5–15)
BUN: 6 mg/dL (ref 6–20)
CO2: 24 mmol/L (ref 22–32)
Calcium: 8.9 mg/dL (ref 8.9–10.3)
Chloride: 101 mmol/L (ref 98–111)
Creatinine, Ser: 0.76 mg/dL (ref 0.44–1.00)
GFR calc Af Amer: >60 mL/min (ref >60)
GFR calc non Af Amer: >60 mL/min (ref >60)
Glucose, Bld: 98 mg/dL (ref 70–99)
Potassium: 3.4 mmol/L — ABNORMAL LOW (ref 3.5–5.1)
Sodium: 135 mmol/L (ref 135–145)
Total Bilirubin: 2.4 mg/dL — ABNORMAL HIGH (ref 0.3–1.2)
Total Protein: 7.8 g/dL (ref 6.5–8.1)

## 2019-02-03 LAB — LACTIC ACID, PLASMA: Lactic Acid, Venous: 0.8 mmol/L (ref 0.5–1.9)

## 2019-02-03 LAB — I-STAT BETA HCG BLOOD, ED (MC, WL, AP ONLY): I-stat hCG, quantitative: <5 m[IU]/mL (ref <5)

## 2019-02-03 LAB — LIPASE, BLOOD: Lipase: 16 U/L (ref 11–51)

## 2019-02-03 MED ORDER — SODIUM CHLORIDE 0.9% FLUSH: 3.0000 mL | Freq: Once | INTRAVENOUS | Status: DC

## 2019-02-03 MED ORDER — ONDANSETRON HCL 4 MG/2ML IJ SOLN: 4.0000 mg | Freq: Once | INTRAMUSCULAR | Status: DC

## 2019-02-03 MED ORDER — ONDANSETRON 4 MG PO TBDP: 4.0000 mg | ORAL_TABLET | Freq: Three times a day (TID) | ORAL | 0 refills | Status: DC | PRN

## 2019-02-03 MED ORDER — CEPHALEXIN 500 MG PO CAPS: 500.0000 mg | ORAL_CAPSULE | Freq: Four times a day (QID) | ORAL | 0 refills | Status: AC

## 2019-02-03 MED ORDER — METOCLOPRAMIDE HCL 5 MG/ML IJ SOLN
10.0000 mg | Freq: Once | INTRAMUSCULAR | Status: AC
  Administered 2019-02-03: 18:00:00 10 mg via INTRAVENOUS
  Filled 2019-02-03: qty 2

## 2019-02-03 MED ORDER — KETOROLAC TROMETHAMINE 30 MG/ML IJ SOLN
15.0000 mg | Freq: Once | INTRAMUSCULAR | Status: AC
  Administered 2019-02-03: 20:00:00 15 mg via INTRAVENOUS
  Filled 2019-02-03: qty 1

## 2019-02-03 MED ORDER — SODIUM CHLORIDE 0.9 % IV BOLUS
1000.0000 mL | Freq: Once | INTRAVENOUS | Status: AC
  Administered 2019-02-03: 1000 mL via INTRAVENOUS

## 2019-02-03 MED ORDER — MORPHINE SULFATE (PF) 4 MG/ML IV SOLN
4.0000 mg | Freq: Once | INTRAVENOUS | Status: AC
  Administered 2019-02-03: 18:00:00 4 mg via INTRAVENOUS
  Filled 2019-02-03: qty 1

## 2019-02-03 MED ORDER — HYDROCODONE-ACETAMINOPHEN 5-325 MG PO TABS: 1.0000 | ORAL_TABLET | Freq: Four times a day (QID) | ORAL | 0 refills | Status: DC | PRN

## 2019-02-03 MED ORDER — SODIUM CHLORIDE 0.9 % IV SOLN
1.0000 g | Freq: Once | INTRAVENOUS | Status: AC
  Administered 2019-02-03: 1 g via INTRAVENOUS
  Filled 2019-02-03: qty 10

## 2019-02-03 MED ORDER — IOHEXOL 300 MG/ML  SOLN
100.0000 mL | Freq: Once | INTRAMUSCULAR | Status: AC | PRN
  Administered 2019-02-03: 100 mL via INTRAVENOUS

## 2019-02-03 NOTE — Discharge Instructions (Addendum)
Pyelonephritis  There is evidence of an infection in the kidney called pyelonephritis.  Pain/Fever:  Antiinflammatory medications: Take 600 mg of ibuprofen every 6 hours or 440 mg (over the counter dose) to 500 mg (prescription dose) of naproxen every 12 hours for the next 3 days. After this time, these medications may be used as needed for pain. Take these medications with food to avoid upset stomach. Choose only one of these medications, do not take them together. Acetaminophen (generic for Tylenol): Should you continue to have additional pain while taking the ibuprofen or naproxen, you may add in acetaminophen as needed. Your daily total maximum amount of acetaminophen from all sources should be limited to 4000mg /day for persons without liver problems, or 2000mg /day for those with liver problems. Vicodin: May take Vicodin (hydrocodone-acetaminophen) as needed for severe pain.   Do not drive or perform other dangerous activities while taking this medication as it can cause drowsiness as well as changes in reaction time and judgement.   Please note that each pill of Vicodin contains 325 mg of acetaminophen (generic for Tylenol) and the above dosage limits apply.  Nausea/vomiting: Use the ondansetron (generic for Zofran) for nausea or vomiting.  This medication may not prevent all vomiting or nausea, but can help facilitate better hydration. Things that can help with nausea/vomiting also include peppermint/menthol candies, vitamin B12, and ginger.  Antibiotics: Please take all of your antibiotics until finished!   You may develop abdominal discomfort or diarrhea from the antibiotic.  You may help offset this with probiotics which you can buy or get in yogurt. Do not eat or take the probiotics until 2 hours after your antibiotic.   Hydration: Symptoms of any other illness will be intensified and complicated by dehydration. Dehydration can also extend the duration of symptoms. Dehydration typically  causes its own symptoms including lightheadedness, nausea, headaches, fatigue increased thirst, and generally feeling unwell. Drink plenty of fluids and get plenty of rest. You should be drinking at least half a liter of water every hour or two to stay hydrated. Electrolyte drinks (ex. Gatorade, Powerade, Pedialyte) are also encouraged. You should be drinking enough fluids to make your urine light yellow, almost clear. If this is not the case, you are not drinking enough water.  Follow-up: Most instances of pyelonephritis may be followed up upon by a primary care provider.  Should symptoms fail to begin to resolve after a few days or they fail to resolve by the end of the antibiotic course, follow-up with a urologist.  Return: Return to the emergency department for significantly worsening pain, inability or significant difficulty urinating, uncontrolled vomiting, spreading pain, or any other major concerns.  There was a mild elevation in the total bilirubin.  This may be due to dehydration.  Have this and the other liver enzymes rechecked by a primary care provider in about 2 weeks.

## 2019-02-03 NOTE — ED Notes (Addendum)
Pt significant other with pt in lobby when called to triage. Pt significant was asked to wait outside or in car until pt gets to a room; staff offered to take down pt significant other phone number so that staff could call and inform him when pt gets to a room. Pt significant other loud with staff and not wanting to leave but walks outside to car after speaking with security regarding our visitation guidelines.

## 2019-02-03 NOTE — ED Provider Notes (Signed)
[facility]-EMERGENCY DEPT Provider Note   CSN: [id number] Arrival date & time: 02/03/19  1447     History   Chief Complaint Chief Complaint  Patient presents with   Abdominal Pain    HPI Tami Powers is a 28 y.o. female.     HPI   Tami Powers is a 28 y.o. female, with a history of anxiety, presenting to the ED with abdominal pain.  She began to have right lower back pain about 7 days ago.  Stabbing, constant, worsening, initially radiating into the right flank and right lower abdomen. About 4 days ago, patient began to have primary right lower quadrant pain, stabbing, constant, 8/10, radiating into the right lower back. She notes constipation with lack of bowel movement over the last 7 days until today when she had soft, dark stool.  She does note she had Pepto-Bismol yesterday. Symptoms accompanied by anorexia, nausea, vomiting, subjective fever, chills. LMP around September 15. Last food intake was yesterday.  She is not breast-feeding.  She does not typically use NSAIDs.  She drinks about once a week or once every 2 weeks.  Denies illicit drug use. Denies cough, chest pain, shortness of breath, hematochezia, abnormal vaginal bleeding, vaginal discharge, syncope, dysuria, hematuria, or any other complaints.     Past Medical History:  Diagnosis Date   Anxiety     Patient Active Problem List   Diagnosis Date Noted   NSVD (normal spontaneous vaginal delivery) 05/07/2015   Indication for care in labor or delivery 05/06/2015   Supervision of other normal pregnancy 12/02/2014    Past Surgical History:  Procedure Laterality Date   WISDOM TOOTH EXTRACTION       OB History    Gravida  2   Para  2   Term  2   Preterm      AB      Living  2     SAB      TAB      Ectopic      Multiple  0   Live Births  2            Home Medications    Prior to Admission medications   Medication Sig Start Date End Date Taking?  Authorizing Provider  acetaminophen (TYLENOL) 325 MG tablet Take 650 mg by mouth every 6 (six) hours as needed for mild pain or headache.   Yes [provider]  ibuprofen (ADVIL) 200 MG tablet Take 400 mg by mouth every 6 (six) hours as needed for moderate pain.   Yes [provider]  cephALEXin (KEFLEX) 500 MG capsule Take 1 capsule (500 mg total) by mouth 4 (four) times daily for 10 days. 02/03/19 02/13/19  Clancy Mullarkey C, PA-C  HYDROcodone-acetaminophen (NORCO/VICODIN) 5-325 MG tablet Take 1-2 tablets by mouth every 6 (six) hours as needed for severe pain. 02/03/19   Latrisha Coiro C, PA-C  ibuprofen (ADVIL,MOTRIN) 800 MG tablet Take 1 tablet (800 mg total) by mouth every 8 (eight) hours as needed. Patient not taking: Reported on 02/03/2019 05/08/15   Brock Bad, MD  Iron-FA-B Cmp-C-Biot-Probiotic (FUSION PLUS) CAPS Take 1 capsule by mouth daily before breakfast. Patient not taking: Reported on 02/03/2019 05/08/15   Brock Bad, MD  ondansetron (ZOFRAN ODT) 4 MG disintegrating tablet Take 1 tablet (4 mg total) by mouth every 8 (eight) hours as needed for nausea or vomiting. 02/03/19   Anselm Pancoast, PA-C    Family History Family History  Problem Relation Age of Onset   Hypertension Mother    Hypertension Maternal Grandmother     Social History Social History   Tobacco Use   Smoking status: Never Smoker   Smokeless tobacco: Never Used  Substance Use Topics   Alcohol use: No    Alcohol/week: 0.0 standard drinks   Drug use: No     Allergies   Patient has no known allergies.   Review of Systems Review of Systems  Constitutional: Positive for appetite change, chills and fever.  Respiratory: Negative for cough and shortness of breath.   Cardiovascular: Negative for chest pain and leg swelling.  Gastrointestinal: Positive for abdominal pain, diarrhea, nausea and vomiting.  Genitourinary: Negative for dysuria, hematuria, vaginal bleeding and vaginal  discharge.  Musculoskeletal: Positive for back pain.  Neurological: Positive for headaches. Negative for dizziness and syncope.  All other systems reviewed and are negative.    Physical Exam Updated Vital Signs BP 126/74    Pulse (!) 118    Temp 100 F (37.8 C) (Oral)    Resp 16    Ht 5\' 1"  (1.549 m)    Wt 77.3 kg    SpO2 100%    BMI 32.22 kg/m   Physical Exam Vitals signs and nursing note reviewed.  Constitutional:      General: She is not in acute distress.    Appearance: She is well-developed. She is not diaphoretic.  HENT:     Head: Normocephalic and atraumatic.     Mouth/Throat:     Mouth: Mucous membranes are moist.     Pharynx: Oropharynx is clear.  Eyes:     Conjunctiva/sclera: Conjunctivae normal.  Neck:     Musculoskeletal: Neck supple.  Cardiovascular:     Rate and Rhythm: Normal rate and regular rhythm.     Pulses: Normal pulses.          Radial pulses are 2+ on the right side and 2+ on the left side.       Posterior tibial pulses are 2+ on the right side and 2+ on the left side.     Heart sounds: Normal heart sounds.     Comments: Tactile temperature in the extremities appropriate and equal bilaterally. Pulmonary:     Effort: Pulmonary effort is normal. No respiratory distress.     Breath sounds: Normal breath sounds.  Abdominal:     Palpations: Abdomen is soft.     Tenderness: There is abdominal tenderness in the right lower quadrant. There is right CVA tenderness. There is no guarding.  Musculoskeletal:     Right lower leg: No edema.     Left lower leg: No edema.  Lymphadenopathy:     Cervical: No cervical adenopathy.  Skin:    General: Skin is warm and dry.  Neurological:     Mental Status: She is alert.  Psychiatric:        Mood and Affect: Mood and affect normal.        Speech: Speech normal.        Behavior: Behavior normal.      ED Treatments / Results  Labs (all labs ordered are listed, but only abnormal results are displayed) Labs  Reviewed  COMPREHENSIVE METABOLIC PANEL - Abnormal; Notable for the following components:      Result Value   Potassium 3.4 (*)    Total Bilirubin 2.4 (*)    All other components within normal limits  CBC WITH DIFFERENTIAL/PLATELET - Abnormal; Notable for the following components:  Neutro Abs 7.9 (*)    All other components within normal limits  URINALYSIS, ROUTINE W REFLEX MICROSCOPIC - Abnormal; Notable for the following components:   Color, Urine AMBER (*)    APPearance CLOUDY (*)    Hgb urine dipstick LARGE (*)    Ketones, ur 20 (*)    Protein, ur 100 (*)    Nitrite POSITIVE (*)    Leukocytes,Ua MODERATE (*)    RBC / HPF >50 (*)    WBC, UA >50 (*)    Bacteria, UA FEW (*)    All other components within normal limits  URINE CULTURE  LACTIC ACID, PLASMA  LIPASE, BLOOD  I-STAT BETA HCG BLOOD, ED ([facility], [facility], [facility] ONLY)  POC URINE PREG, ED    EKG None  Radiology Ct Abdomen Pelvis W Contrast  Result Date: 02/03/2019 CLINICAL DATA:  Abdominal pain right lower quadrant pain EXAM: CT ABDOMEN AND PELVIS WITH CONTRAST TECHNIQUE: Multidetector CT imaging of the abdomen and pelvis was performed using the standard protocol following bolus administration of intravenous contrast. CONTRAST:  OMNIPAQUE IOHEXOL 300 MG/ML  SOLN COMPARISON:  CT 05/26/2013 FINDINGS: Lower chest: Lung bases demonstrate no acute consolidation or effusion. The heart size is normal. Hepatobiliary: No focal liver abnormality is seen. No gallstones, gallbladder wall thickening, or biliary dilatation. Pancreas: Unremarkable. No pancreatic ductal dilatation or surrounding inflammatory changes. Spleen: Normal in size without focal abnormality. Adrenals/Urinary Tract: Adrenal glands are normal. Mild right perinephric fat stranding. Patchy hypoenhancement within the upper and midpole of the right kidney. Mild urothelial enhancement of the right renal pelvis and ureter. Urinary bladder unremarkable Stomach/Bowel: Stomach is  within normal limits. Appendix appears normal. No evidence of bowel wall thickening, distention, or inflammatory changes. Vascular/Lymphatic: No significant vascular findings are present. No enlarged abdominal or pelvic lymph nodes. Reproductive: IUD in the uterus.  No adnexal mass Other: No abdominal wall hernia or abnormality. No abdominopelvic ascites. Musculoskeletal: No acute or significant osseous findings. IMPRESSION: 1. Negative for acute appendicitis. 2. Mild right perinephric fat stranding with patchy hypoenhancement within the upper and mid pole of right kidney. Mild urothelial enhancement of right renal pelvis and ureter, findings felt consistent with ascending urinary tract infection/pyelonephritis. Electronically Signed   By: Jasmine Pang M.D.   On: 02/03/2019 18:51    Procedures Procedures (including critical care time)  Medications Ordered in ED Medications  sodium chloride flush (NS) 0.9 % injection 3 mL (3 mLs Intravenous Not Given 02/03/19 1759)  sodium chloride 0.9 % bolus 1,000 mL (0 mLs Intravenous Stopped 02/03/19 1937)  morphine 4 MG/ML injection 4 mg (4 mg Intravenous Given 02/03/19 1758)  metoCLOPramide (REGLAN) injection 10 mg (10 mg Intravenous Given 02/03/19 1758)  iohexol (OMNIPAQUE) 300 MG/ML solution 100 mL (100 mLs Intravenous Contrast Given 02/03/19 1837)  cefTRIAXone (ROCEPHIN) 1 g in sodium chloride 0.9 % 100 mL IVPB (1 g Intravenous New Bag/Given 02/03/19 1936)  ketorolac (TORADOL) 30 MG/ML injection 15 mg (15 mg Intravenous Given 02/03/19 1936)     Initial Impression / Assessment and Plan / ED Course  I have reviewed the triage vital signs and the nursing notes.  Pertinent labs & imaging results that were available during my care of the patient were reviewed by me and considered in my medical decision making (see chart for details).  Clinical Course as of Feb 02 2034  Mon Feb 03, 2019  1825 Pain well controlled.    [SJ]  1915 Readjusted BP cuff. BP  noted to be 107/59.  BP(!): 97/52 [SJ]  2031 Patient states she feels much better.  She would like to go home.  She states she typically has somewhat of an elevated pulse due to her anxiety.  She does not want to wait for any further management.   [SJ]    Clinical Course User Index [SJ] Marylene Masek C, PA-C       Patient presents with right-sided pain along with vomiting and fever.  She is nontoxic-appearing. Borderline elevated temperature and tachycardia upon initial presentation.  Low suspicion for sepsis at this time.  Evidence of infection on UA.  CT findings consistent with pyelonephritis without noted obstruction. Suspect patient's mildly elevated total bilirubin and her ketonuria are due to dehydration.  This was addressed with IV fluids.  Tolerating oral fluids and will continue to hydrate orally. She improved over the ED course.  She will follow-up with her primary care provider. The patient was given instructions for home care as well as return precautions. Patient voices understanding of these instructions, accepts the plan, and is comfortable with discharge.   Vitals:   02/03/19 1911 02/03/19 1912 02/03/19 2000 02/03/19 2015  BP:  (!) 107/59 111/60   Pulse: (!) 117   (!) 106  Resp:      Temp:      TempSrc:      SpO2: 97%   99%  Weight:      Height:         Final Clinical Impressions(s) / ED Diagnoses   Final diagnoses:  Pyelonephritis    ED Discharge Orders         Ordered    cephALEXin (KEFLEX) 500 MG capsule  4 times daily     02/03/19 2010    ondansetron (ZOFRAN ODT) 4 MG disintegrating tablet  Every 8 hours PRN     02/03/19 2010    HYDROcodone-acetaminophen (NORCO/VICODIN) 5-325 MG tablet  Every 6 hours PRN     02/03/19 2010           Anselm PancoastJoy, Jaleesa Cervi C, PA-C 02/03/19 2035    Little, Ambrose Finlandachel Morgan, MD 02/06/19 [phone]

## 2019-02-03 NOTE — ED Notes (Signed)
Patient was with her significant other in the lobby. Significant other did not have a mask on. Significant other was asked to leave and the significant other became loud and refusing to go. Security and [employer] with the patient and significant other  Trying to resolve the situation. Patient then refused to wear a mask. A mask was taken to the patient and was told that it was policy tht she wear the mask. Patient states, " I do do not have to wear a mask." Writer told the patient that she was required to wear a mask. Patient put the mask on. Patient's significant other became verbally argumentive with security and [employer] officer .Marland Kitchen

## 2019-02-03 NOTE — ED Triage Notes (Signed)
Pt complaint of RLQ pain with n/v; sent from urgent care for appy rule out.Pain for 6 days.

## 2019-02-05 LAB — URINE CULTURE: Culture: >=100000 — AB

## 2019-02-06 ENCOUNTER — Telehealth: Payer: Self-pay

## 2019-02-06 NOTE — Progress Notes (Signed)
ED Antimicrobial Stewardship Positive Culture Follow Up   Tami Powers is an 28 y.o. female who presented to [facility] on 02/03/2019 with a chief complaint of  Chief Complaint  Patient presents with  . Abdominal Pain    Recent Results (from the past 720 hour(s))  Urine culture     Status: Abnormal   Collection Time: 02/03/19  5:44 PM   Specimen: Urine, Random  Result Value Ref Range Status   Specimen Description   Final    URINE, RANDOM Performed at [facility] [address] [place], [place] [address]    Special Requests   Final    NONE Performed at [facility], [place] [address], [place], [place] [address]    Culture >=100,000 COLONIES/mL ESCHERICHIA COLI (A)  Final   Report Status 02/05/2019 FINAL  Final   Organism ID, Bacteria ESCHERICHIA COLI (A)  Final      Susceptibility   Escherichia coli - MIC*    AMPICILLIN >=32 RESISTANT Resistant     CEFAZOLIN >=64 RESISTANT Resistant     CEFTRIAXONE <=1 SENSITIVE Sensitive     CIPROFLOXACIN <=0.25 SENSITIVE Sensitive     GENTAMICIN <=1 SENSITIVE Sensitive     IMIPENEM <=0.25 SENSITIVE Sensitive     NITROFURANTOIN <=16 SENSITIVE Sensitive     TRIMETH/SULFA <=20 SENSITIVE Sensitive     AMPICILLIN/SULBACTAM 16 INTERMEDIATE Intermediate     PIP/TAZO <=4 SENSITIVE Sensitive     Extended ESBL NEGATIVE Sensitive     * >=100,000 COLONIES/mL ESCHERICHIA COLI    [x]  Treated with cephalexin, organism resistant to prescribed antimicrobial []  Patient discharged originally without antimicrobial agent and treatment is now indicated  New antibiotic prescription: Ciprofloxacin 500mg  PO BID x 7 days D/c keflex  ED Provider: Laverda Page, PharmD Candidate 02/06/2019, 10:00 AM

## 2019-02-12 NOTE — Telephone Encounter (Signed)
Post ED Visit - Positive Culture Follow-up: Unsuccessful Patient Follow-up  Culture assessed and recommendations reviewed by:  []  Elenor Quinones, Pharm.D. []  Heide Guile, Pharm.D., BCPS AQ-ID []  Parks Neptune, Pharm.D., BCPS []  Alycia Rossetti, Pharm.D., BCPS []  [place], Pharm.D., BCPS, AAHIVP []  Legrand Como, Pharm.D., BCPS, AAHIVP []  Wynell Balloon, PharmD []  Vincenza Hews, PharmD, BCPS Colin Rhein Pharm D Positive urine culture  []  Patient discharged without antimicrobial prescription and treatment is now indicated [x]  Organism is resistant to prescribed ED discharge antimicrobial []  Patient with positive blood cultures   Unable to contact patient after 3 attempts, letter will be sent to address on file  Genia Del 02/12/2019, 9:17 AM

## 2020-05-07 IMAGING — CT CT ABD-PELV W/ CM
2 of 4 series · 15 of 46 positions shown, 17 images · IV contrast (OMNIPAQUE 300)
Comparison: CT 05/26/2013

CLINICAL DATA: Abdominal pain right lower quadrant pain

EXAM:
CT ABDOMEN AND PELVIS WITH CONTRAST
TECHNIQUE: Multidetector CT imaging of the abdomen and pelvis was performed
using the standard protocol following bolus administration of
intravenous contrast.
CONTRAST:  100mL OMNIPAQUE IOHEXOL 300 MG/ML  SOLN

[Series 2: axial st · axial · 0.67mm/px · z∈[+1044,+1459]mm · 12 of 95 slices shown, 14 images]
[im 6/95  soft-tissue]
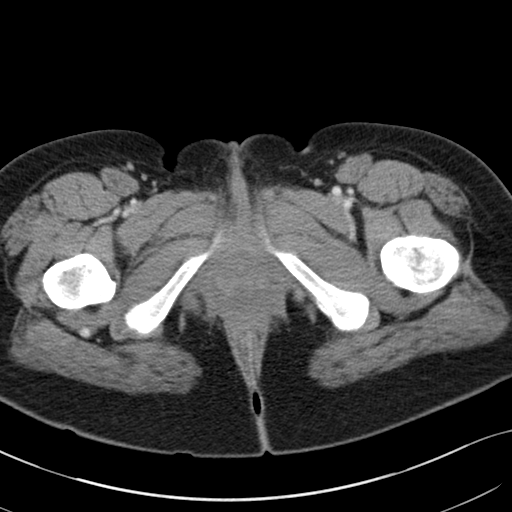
[im 6/95  bone]
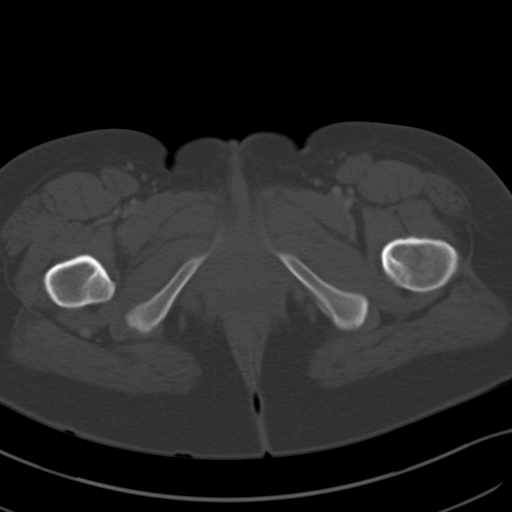
[im 16/95  soft-tissue]
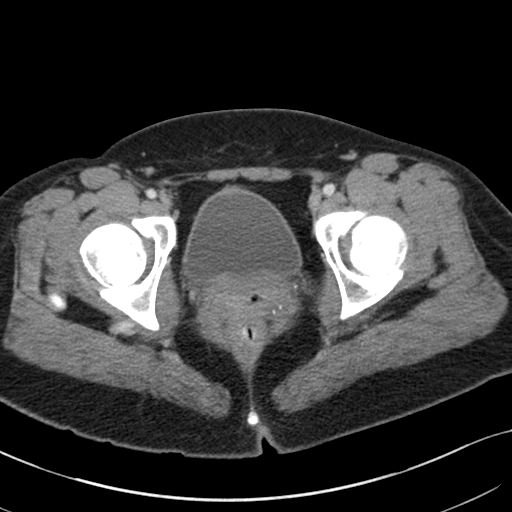
[im 21/95  soft-tissue]
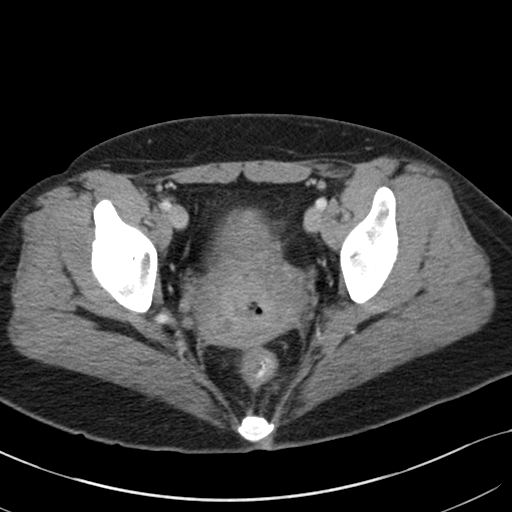
[im 27/95  soft-tissue]
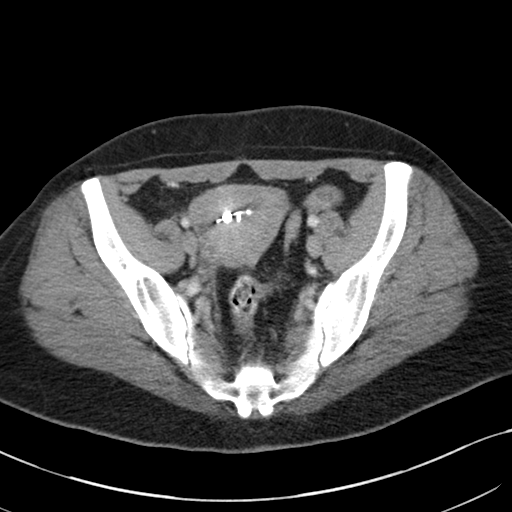
[im 37/95  soft-tissue]
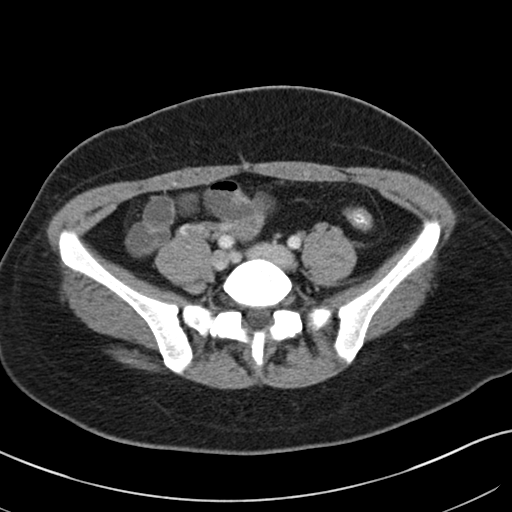
[im 42/95  soft-tissue]
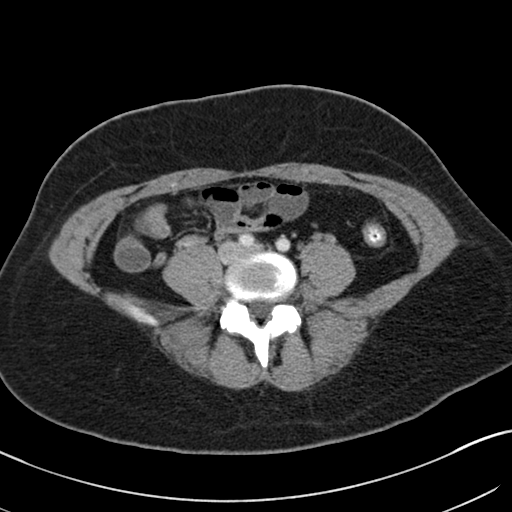
[im 53/95  soft-tissue]
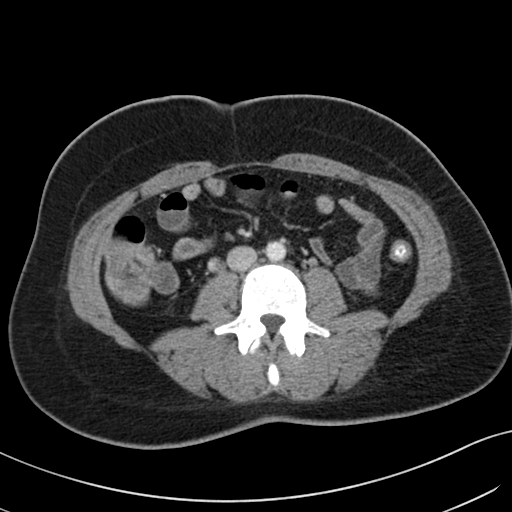
[im 58/95  soft-tissue]
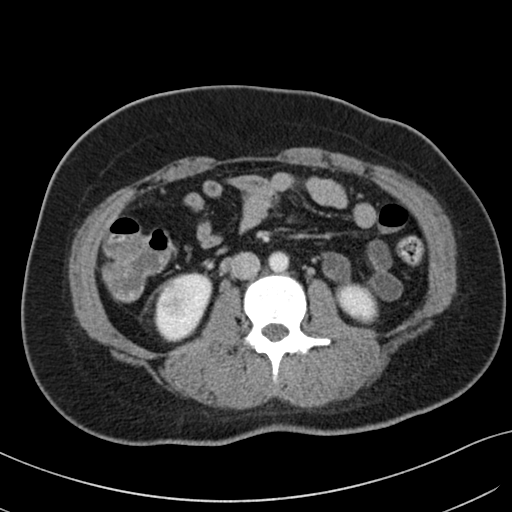
[im 68/95  soft-tissue]
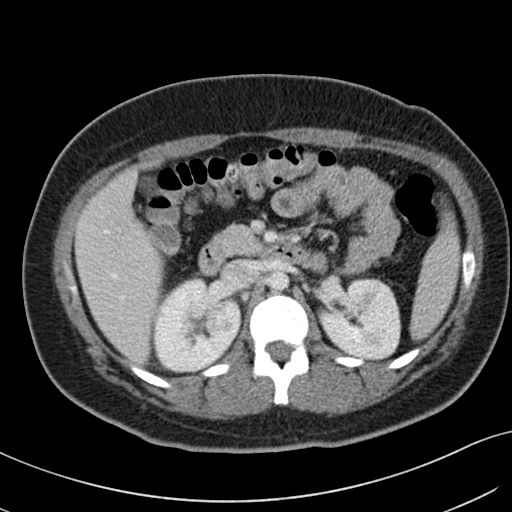
[im 68/95  bone]
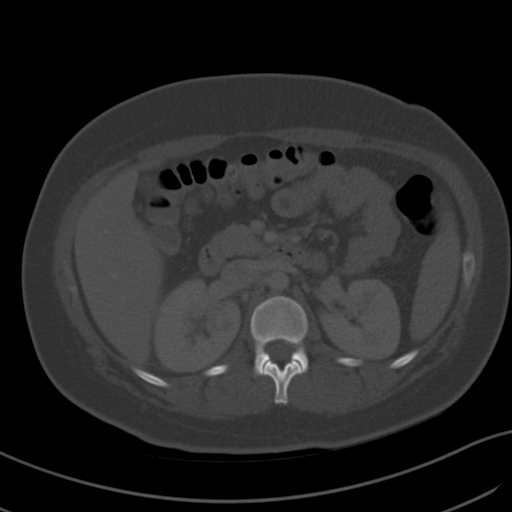
[im 74/95  soft-tissue]
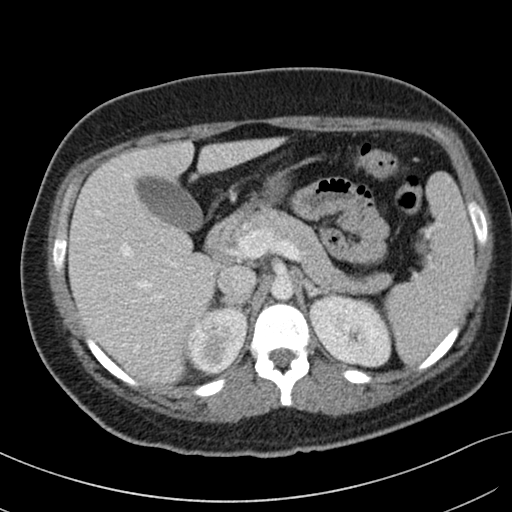
[im 79/95  soft-tissue]
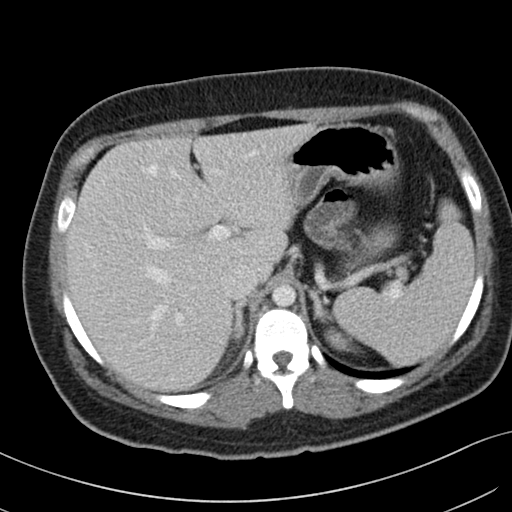
[im 89/95  soft-tissue]
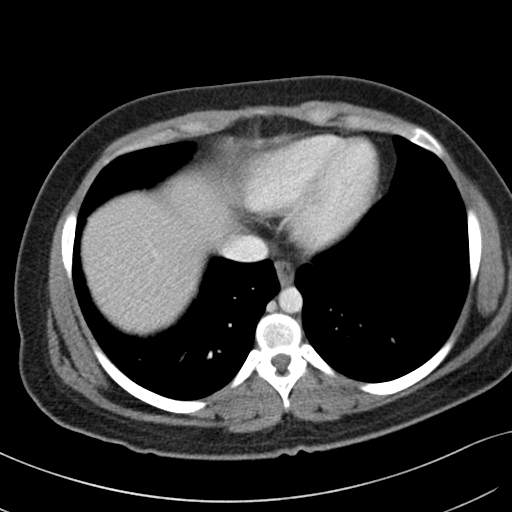

[Series 5: coronal st · coronal · 0.65mm/px · 3 of 111 slices shown]
[im 37/111  soft-tissue]
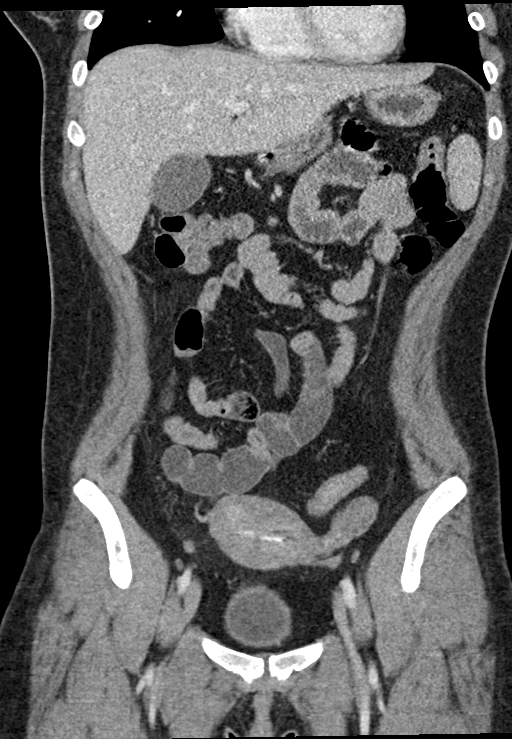
[im 49/111  soft-tissue]
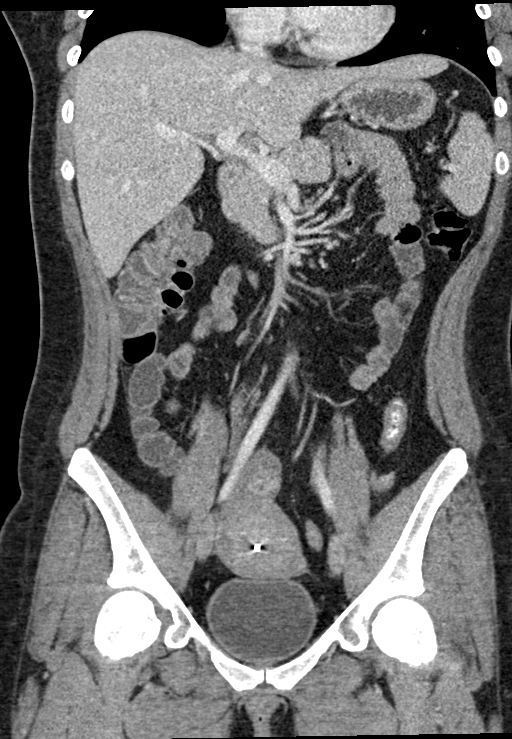
[im 62/111  soft-tissue]
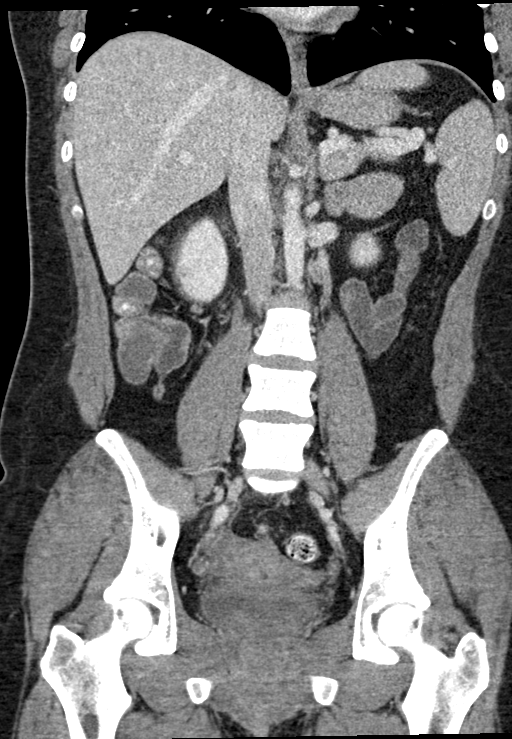

[15 of 46 positions shown; findings below may reference images not displayed]

FINDINGS: Lower chest: Lung bases demonstrate no acute consolidation or
effusion. The heart size is normal.

Hepatobiliary: No focal liver abnormality is seen. No gallstones,
gallbladder wall thickening, or biliary dilatation.

Pancreas: Unremarkable. No pancreatic ductal dilatation or
surrounding inflammatory changes.

Spleen: Normal in size without focal abnormality.

Adrenals/Urinary Tract: Adrenal glands are normal. Mild right
perinephric fat stranding. Patchy hypoenhancement within the upper
and midpole of the right kidney. Mild urothelial enhancement of the
right renal pelvis and ureter. Urinary bladder unremarkable

Stomach/Bowel: Stomach is within normal limits. Appendix appears
normal. No evidence of bowel wall thickening, distention, or
inflammatory changes.

Vascular/Lymphatic: No significant vascular findings are present. No
enlarged abdominal or pelvic lymph nodes.

Reproductive: IUD in the uterus.  No adnexal mass

Other: No abdominal wall hernia or abnormality. No abdominopelvic
ascites.

Musculoskeletal: No acute or significant osseous findings.
IMPRESSION: 1. Negative for acute appendicitis.
2. Mild right perinephric fat stranding with patchy hypoenhancement
within the upper and mid pole of right kidney. Mild urothelial
enhancement of right renal pelvis and ureter, findings felt
consistent with ascending urinary tract infection/pyelonephritis.

## 2021-06-29 ENCOUNTER — Encounter (HOSPITAL_BASED_OUTPATIENT_CLINIC_OR_DEPARTMENT_OTHER): Payer: Self-pay | Admitting: Emergency Medicine

## 2021-06-29 ENCOUNTER — Emergency Department (HOSPITAL_BASED_OUTPATIENT_CLINIC_OR_DEPARTMENT_OTHER)
Admission: EM | Admit: 2021-06-29 | Discharge: 2021-06-29 | Disposition: A | Discharge status: Home / Self Care | Payer: Medicaid Other | Attending: Emergency Medicine | Admitting: Emergency Medicine

## 2021-06-29 ENCOUNTER — Other Ambulatory Visit: Payer: Self-pay

## 2021-06-29 DIAGNOSIS — K5900 Constipation, unspecified: Secondary | ICD-10-CM | POA: Diagnosis not present

## 2021-06-29 DIAGNOSIS — R531 Weakness: Secondary | ICD-10-CM

## 2021-06-29 DIAGNOSIS — R5383 Other fatigue: Secondary | ICD-10-CM

## 2021-06-29 DIAGNOSIS — D582 Other hemoglobinopathies: Secondary | ICD-10-CM | POA: Diagnosis not present

## 2021-06-29 DIAGNOSIS — E860 Dehydration: Secondary | ICD-10-CM | POA: Insufficient documentation

## 2021-06-29 DIAGNOSIS — N12 Tubulo-interstitial nephritis, not specified as acute or chronic: Secondary | ICD-10-CM | POA: Diagnosis not present

## 2021-06-29 LAB — URINALYSIS, ROUTINE W REFLEX MICROSCOPIC
Glucose, UA: NEGATIVE mg/dL
Ketones, ur: >=80 mg/dL — AB
Nitrite: POSITIVE — AB
Protein, ur: 30 mg/dL — AB
Specific Gravity, Urine: >1.03 — ABNORMAL HIGH (ref 1.005–1.030)
pH: 5.5 (ref 5.0–8.0)

## 2021-06-29 LAB — URINALYSIS, MICROSCOPIC (REFLEX): WBC, UA: >50 WBC/hpf (ref 0–5)

## 2021-06-29 LAB — CBC
HCT: 46.2 % — ABNORMAL HIGH (ref 36.0–46.0)
Hemoglobin: 15.5 g/dL — ABNORMAL HIGH (ref 12.0–15.0)
MCH: 29.4 pg (ref 26.0–34.0)
MCHC: 33.5 g/dL (ref 30.0–36.0)
MCV: 87.5 fL (ref 80.0–100.0)
Platelets: 233 10*3/uL (ref 150–400)
RBC: 5.28 MIL/uL — ABNORMAL HIGH (ref 3.87–5.11)
RDW: 12.4 % (ref 11.5–15.5)
WBC: 5 10*3/uL (ref 4.0–10.5)
nRBC: 0 % (ref 0.0–0.2)

## 2021-06-29 LAB — COMPREHENSIVE METABOLIC PANEL
ALT: 17 U/L (ref 0–44)
AST: 19 U/L (ref 15–41)
Albumin: 4.6 g/dL (ref 3.5–5.0)
Alkaline Phosphatase: 52 U/L (ref 38–126)
Anion gap: 10 (ref 5–15)
BUN: 9 mg/dL (ref 6–20)
CO2: 26 mmol/L (ref 22–32)
Calcium: 9.6 mg/dL (ref 8.9–10.3)
Chloride: 100 mmol/L (ref 98–111)
Creatinine, Ser: 0.65 mg/dL (ref 0.44–1.00)
GFR, Estimated: >60 mL/min (ref >60)
Glucose, Bld: 92 mg/dL (ref 70–99)
Potassium: 3.9 mmol/L (ref 3.5–5.1)
Sodium: 136 mmol/L (ref 135–145)
Total Bilirubin: 2.5 mg/dL — ABNORMAL HIGH (ref 0.3–1.2)
Total Protein: 8.1 g/dL (ref 6.5–8.1)

## 2021-06-29 LAB — LIPASE, BLOOD: Lipase: 30 U/L (ref 11–51)

## 2021-06-29 LAB — PREGNANCY, URINE: Preg Test, Ur: NEGATIVE

## 2021-06-29 MED ORDER — CEPHALEXIN 500 MG PO CAPS: 500.0000 mg | ORAL_CAPSULE | Freq: Three times a day (TID) | ORAL | 0 refills | Status: AC

## 2021-06-29 MED ORDER — ONDANSETRON 4 MG PO TBDP: 4.0000 mg | ORAL_TABLET | Freq: Three times a day (TID) | ORAL | 0 refills | Status: AC | PRN

## 2021-06-29 NOTE — ED Provider Notes (Signed)
?[facility] EMERGENCY DEPARTMENT ?Provider Note ? ? ?CSN: [id number] ?Arrival date & time: 06/29/21  1203 ? ?  ? ?History ? ?Chief Complaint  ?Patient presents with  ? Abdominal Pain  ? ? ?Tami Powers is a 31 y.o. female. ? ?HPI ? ?  ?31yo female with history of anxiety, presents with concern for generalized weakness, nausea.  ? ?For the past 2 weeks has had generalized weakness, fatigue, not feeling herself ? ?Low appetite, has not had much to eat ? ?Nausea, no vomiting ? ?Reports she was told she had low iron recently and is on iron pills. ?Has middle abdominal pain ?Constipation, is passing flatus ?No hx of abdominal surgeries ?No diarrhea, no fevers, no vaginal bleeding or discharge, no cough, no dyspnea or chest pain.  ? ? ?Past Medical History:  ?Diagnosis Date  ? Anxiety   ?  ?Home Medications ?Prior to Admission medications   ?Medication Sig Start Date End Date Taking? Authorizing Provider  ?cephALEXin (KEFLEX) 500 MG capsule Take 1 capsule (500 mg total) by mouth 3 (three) times daily for 10 days. 06/29/21 07/09/21 Yes Alvira Monday, MD  ?ondansetron (ZOFRAN-ODT) 4 MG disintegrating tablet Take 1 tablet (4 mg total) by mouth every 8 (eight) hours as needed for nausea or vomiting. 06/29/21  Yes Alvira Monday, MD  ?acetaminophen (TYLENOL) 325 MG tablet Take 650 mg by mouth every 6 (six) hours as needed for mild pain or headache.    [provider]  ?HYDROcodone-acetaminophen (NORCO/VICODIN) 5-325 MG tablet Take 1-2 tablets by mouth every 6 (six) hours as needed for severe pain. 02/03/19   Joy, Shawn C, PA-C  ?ibuprofen (ADVIL) 200 MG tablet Take 400 mg by mouth every 6 (six) hours as needed for moderate pain.    [provider]  ?ibuprofen (ADVIL,MOTRIN) 800 MG tablet Take 1 tablet (800 mg total) by mouth every 8 (eight) hours as needed. ?Patient not taking: Reported on 02/03/2019 05/08/15   Brock Bad, MD  ?Iron-FA-B Cmp-C-Biot-Probiotic (FUSION PLUS) CAPS Take 1  capsule by mouth daily before breakfast. ?Patient not taking: Reported on 02/03/2019 05/08/15   Brock Bad, MD  ?   ? ?Allergies    ?Patient has no known allergies.   ? ?Review of Systems   ?Review of Systems ? ?Physical Exam ?Updated Vital Signs ?BP 118/84 (BP Location: Right Arm)   Pulse 93   Temp 98.3 ?F (36.8 ?C) (Oral)   Resp 18   Ht 5\' 2"  (1.575 m)   Wt 68 kg   LMP 06/15/2021   SpO2 100%   BMI 27.44 kg/m?  ?Physical Exam ?Vitals and nursing note reviewed.  ?Constitutional:   ?   General: She is not in acute distress. ?   Appearance: She is well-developed. She is not diaphoretic.  ?HENT:  ?   Head: Normocephalic and atraumatic.  ?Eyes:  ?   Conjunctiva/sclera: Conjunctivae normal.  ?Cardiovascular:  ?   Rate and Rhythm: Normal rate and regular rhythm.  ?   Heart sounds: Normal heart sounds. No murmur heard. ?  No friction rub. No gallop.  ?Pulmonary:  ?   Effort: Pulmonary effort is normal. No respiratory distress.  ?   Breath sounds: Normal breath sounds. No wheezing or rales.  ?Abdominal:  ?   General: There is no distension.  ?   Palpations: Abdomen is soft.  ?   Tenderness: There is abdominal tenderness (mild periumbilical). There is no guarding.  ?Musculoskeletal:     ?  General: No tenderness.  ?   Cervical back: Normal range of motion.  ?Skin: ?   General: Skin is warm and dry.  ?   Findings: No erythema or rash.  ?Neurological:  ?   Mental Status: She is alert and oriented to person, place, and time.  ? ? ?ED Results / Procedures / Treatments   ?Labs ?(all labs ordered are listed, but only abnormal results are displayed) ?Labs Reviewed  ?COMPREHENSIVE METABOLIC PANEL - Abnormal; Notable for the following components:  ?    Result Value  ? Total Bilirubin 2.5 (*)   ? All other components within normal limits  ?CBC - Abnormal; Notable for the following components:  ? RBC 5.28 (*)   ? Hemoglobin 15.5 (*)   ? HCT 46.2 (*)   ? All other components within normal limits  ?URINALYSIS, ROUTINE W  REFLEX MICROSCOPIC - Abnormal; Notable for the following components:  ? Color, Urine AMBER (*)   ? APPearance CLOUDY (*)   ? Specific Gravity, Urine >1.030 (*)   ? Hgb urine dipstick MODERATE (*)   ? Bilirubin Urine SMALL (*)   ? Ketones, ur >=80 (*)   ? Protein, ur 30 (*)   ? Nitrite POSITIVE (*)   ? Leukocytes,Ua SMALL (*)   ? All other components within normal limits  ?URINALYSIS, MICROSCOPIC (REFLEX) - Abnormal; Notable for the following components:  ? Bacteria, UA MANY (*)   ? All other components within normal limits  ?LIPASE, BLOOD  ?PREGNANCY, URINE  ? ? ?EKG ?None ? ?Radiology ?No results found. ? ?Procedures ?Procedures  ? ? ?Medications Ordered in ED ?Medications - No data to display ? ?ED Course/ Medical Decision Making/ A&P ?  ?                        ?Medical Decision Making ?Amount and/or Complexity of Data Reviewed ?Labs: ordered. ? ?Risk ?Prescription drug management. ? ? ? ?31yo female with history of anxiety, presents with concern for generalized weakness, nausea. ? ?Labs personally evaluated by me and interpreted. ?Has no anemia, does have mild elevation in hgb, likely secondary to dehydration/hemo-concentration.  No transaminitis, no electrolyte abnormalities, no pancreatitis. Has mild bilirubin elevation that was also present in 2020, recommend PCP follow up. Pregnancy test negative. ? ?Urinalysis consistent with UTI.  Suspect this is likely etiology of nausea and fatigue.  She reports periumbilical abdominal pain and mild tenderness, do not feel history or exam consistent with SBO, appendicitis, PID, TOA, nephrolithiasis, incarcerated hernia. ? ?Given rx for keflex and zofran. Recommend PCP follow up and discussed reasons to retur. Patient discharged in stable condition with understanding of reasons to return.  ? ? ? ? ? ? ?Final Clinical Impression(s) / ED Diagnoses ?Final diagnoses:  ?Other fatigue  ?Generalized weakness  ?Pyelonephritis  ?Dehydration  ? ? ?Rx / DC Orders ?ED Discharge  Orders   ? ?      Ordered  ?  cephALEXin (KEFLEX) 500 MG capsule  3 times daily       ? 06/29/21 1415  ?  ondansetron (ZOFRAN-ODT) 4 MG disintegrating tablet  Every 8 hours PRN       ? 06/29/21 1415  ? ?  ?  ? ?  ? ? ?  ?Alvira Monday, MD ?06/30/21 0041 ? ?

## 2021-06-29 NOTE — ED Notes (Signed)
Pt not in room at this time; appears to have left w/o discharge instructions ?

## 2021-06-29 NOTE — ED Triage Notes (Signed)
Reports mid abdominal pain, fatigue, and nausea for the last two weeks.  Feels foggy headed.  Hx of low iron.  Currently on iron pills. ?

## 2021-10-25 ENCOUNTER — Inpatient Hospital Stay (HOSPITAL_COMMUNITY): Payer: Medicaid Other

## 2021-10-25 ENCOUNTER — Encounter (HOSPITAL_COMMUNITY): Payer: Self-pay | Admitting: *Deleted

## 2021-10-25 ENCOUNTER — Inpatient Hospital Stay (HOSPITAL_COMMUNITY)
Admission: AD | Admit: 2021-10-25 | Discharge: 2021-10-25 | Disposition: A | Discharge status: Home / Self Care | Payer: Medicaid Other | Attending: Obstetrics & Gynecology | Admitting: Obstetrics & Gynecology

## 2021-10-25 DIAGNOSIS — R109 Unspecified abdominal pain: Secondary | ICD-10-CM | POA: Insufficient documentation

## 2021-10-25 DIAGNOSIS — Z3A01 Less than 8 weeks gestation of pregnancy: Secondary | ICD-10-CM | POA: Diagnosis not present

## 2021-10-25 DIAGNOSIS — Z3490 Encounter for supervision of normal pregnancy, unspecified, unspecified trimester: Secondary | ICD-10-CM | POA: Diagnosis not present

## 2021-10-25 DIAGNOSIS — O26891 Other specified pregnancy related conditions, first trimester: Secondary | ICD-10-CM | POA: Insufficient documentation

## 2021-10-25 DIAGNOSIS — R509 Fever, unspecified: Secondary | ICD-10-CM | POA: Insufficient documentation

## 2021-10-25 DIAGNOSIS — W19XXXA Unspecified fall, initial encounter: Secondary | ICD-10-CM | POA: Diagnosis not present

## 2021-10-25 LAB — WET PREP, GENITAL
Clue Cells Wet Prep HPF POC: NONE SEEN
Sperm: NONE SEEN
Trich, Wet Prep: NONE SEEN
WBC, Wet Prep HPF POC: >=10 — AB (ref <10)
Yeast Wet Prep HPF POC: NONE SEEN

## 2021-10-25 LAB — CBC
HCT: 40.2 % (ref 36.0–46.0)
Hemoglobin: 13.8 g/dL (ref 12.0–15.0)
MCH: 29.9 pg (ref 26.0–34.0)
MCHC: 34.3 g/dL (ref 30.0–36.0)
MCV: 87 fL (ref 80.0–100.0)
Platelets: 236 10*3/uL (ref 150–400)
RBC: 4.62 MIL/uL (ref 3.87–5.11)
RDW: 12.6 % (ref 11.5–15.5)
WBC: 6.1 10*3/uL (ref 4.0–10.5)
nRBC: 0 % (ref 0.0–0.2)

## 2021-10-25 LAB — URINALYSIS, ROUTINE W REFLEX MICROSCOPIC
Bilirubin Urine: NEGATIVE
Glucose, UA: NEGATIVE mg/dL
Hgb urine dipstick: NEGATIVE
Ketones, ur: NEGATIVE mg/dL
Leukocytes,Ua: NEGATIVE
Nitrite: NEGATIVE
Protein, ur: NEGATIVE mg/dL
Specific Gravity, Urine: 1.004 — ABNORMAL LOW (ref 1.005–1.030)
pH: 6 (ref 5.0–8.0)

## 2021-10-25 LAB — HIV ANTIBODY (ROUTINE TESTING W REFLEX): HIV Screen 4th Generation wRfx: NONREACTIVE

## 2021-10-25 LAB — HCG, QUANTITATIVE, PREGNANCY: hCG, Beta Chain, Quant, S: 2656 m[IU]/mL — ABNORMAL HIGH (ref <5)

## 2021-10-25 NOTE — MAU Provider Note (Signed)
History     CSN: [id number]  Arrival date and time: 10/25/21 1815   Event Date/Time   First Provider Initiated Contact with Patient 10/25/21 2135     Chief Complaint  Patient presents with   Fever   Abdominal Pain   HPI  Tami Powers is a 31 y.o. G3P2002 at [redacted]w[redacted]d who presents for evaluation of abdominal pain. Patient reports she tripped on the stairs and hit her abdomen with her knee. She states since then she has been cramping. Patient rates the pain as a 3/10 and has not tried anything for the pain. She denies any vaginal bleeding, discharge, and leaking of fluid. Denies any constipation, diarrhea or any urinary complaints.  OB History     Gravida  3   Para  2   Term  2   Preterm      AB      Living  2      SAB      IAB      Ectopic      Multiple  0   Live Births  2           Past Medical History:  Diagnosis Date   Anxiety     Past Surgical History:  Procedure Laterality Date   WISDOM TOOTH EXTRACTION      Family History  Problem Relation Age of Onset   Hypertension Mother    Hypertension Maternal Grandmother     Social History   Tobacco Use   Smoking status: Never   Smokeless tobacco: Never  Substance Use Topics   Alcohol use: No    Alcohol/week: 0.0 standard drinks of alcohol   Drug use: No    Allergies: No Known Allergies  No medications prior to admission.    Review of Systems  Constitutional: Negative.  Negative for fatigue and fever.  HENT: Negative.    Respiratory: Negative.  Negative for shortness of breath.   Cardiovascular: Negative.  Negative for chest pain.  Gastrointestinal:  Positive for abdominal pain. Negative for constipation, diarrhea, nausea and vomiting.  Genitourinary: Negative.  Negative for dysuria, vaginal bleeding and vaginal discharge.  Neurological: Negative.  Negative for dizziness and headaches.   Physical Exam   Blood pressure (!) 96/54, pulse 86, temperature 98.4 F (36.9 C), temperature  source Oral, resp. rate 17, height 5\' 2"  (1.575 m), weight 71.5 kg, last menstrual period 09/17/2021, SpO2 99 %, unknown if currently breastfeeding.  Patient Vitals for the past 24 hrs:  BP Temp Temp src Pulse Resp SpO2 Height Weight  10/25/21 1855 (!) 96/54 98.4 F (36.9 C) Oral 86 17 99 % 5\' 2"  (1.575 m) 71.5 kg    Physical Exam Vitals and nursing note reviewed.  Constitutional:      General: She is not in acute distress.    Appearance: She is well-developed.  HENT:     Head: Normocephalic.  Eyes:     Pupils: Pupils are equal, round, and reactive to light.  Cardiovascular:     Rate and Rhythm: Normal rate and regular rhythm.     Heart sounds: Normal heart sounds.  Pulmonary:     Effort: Pulmonary effort is normal. No respiratory distress.     Breath sounds: Normal breath sounds.  Abdominal:     General: Bowel sounds are normal. There is no distension.     Palpations: Abdomen is soft.     Tenderness: There is no abdominal tenderness.  Skin:    General: Skin is warm and  dry.  Neurological:     Mental Status: She is alert and oriented to person, place, and time.  Psychiatric:        Mood and Affect: Mood normal.        Behavior: Behavior normal.        Thought Content: Thought content normal.        Judgment: Judgment normal.     MAU Course  Procedures  Results for orders placed or performed during the hospital encounter of 10/25/21 (from the past 24 hour(s))  Urinalysis, Routine w reflex microscopic Urine, Clean Catch     Status: Abnormal   Collection Time: 10/25/21  6:43 PM  Result Value Ref Range   Color, Urine STRAW (A) YELLOW   APPearance CLEAR CLEAR   Specific Gravity, Urine 1.004 (L) 1.005 - 1.030   pH 6.0 5.0 - 8.0   Glucose, UA NEGATIVE NEGATIVE mg/dL   Hgb urine dipstick NEGATIVE NEGATIVE   Bilirubin Urine NEGATIVE NEGATIVE   Ketones, ur NEGATIVE NEGATIVE mg/dL   Protein, ur NEGATIVE NEGATIVE mg/dL   Nitrite NEGATIVE NEGATIVE   Leukocytes,Ua NEGATIVE  NEGATIVE  hCG, quantitative, pregnancy     Status: Abnormal   Collection Time: 10/25/21  7:43 PM  Result Value Ref Range   hCG, Beta Chain, Quant, S 2,656 (H) <5 mIU/mL  CBC     Status: None   Collection Time: 10/25/21  7:43 PM  Result Value Ref Range   WBC 6.1 4.0 - 10.5 K/uL   RBC 4.62 3.87 - 5.11 MIL/uL   Hemoglobin 13.8 12.0 - 15.0 g/dL   HCT 24.4 01.0 - 27.2 %   MCV 87.0 80.0 - 100.0 fL   MCH 29.9 26.0 - 34.0 pg   MCHC 34.3 30.0 - 36.0 g/dL   RDW 53.6 64.4 - 03.4 %   Platelets 236 150 - 400 K/uL   nRBC 0.0 0.0 - 0.2 %  Wet prep, genital     Status: Abnormal   Collection Time: 10/25/21  8:45 PM  Result Value Ref Range   Yeast Wet Prep HPF POC NONE SEEN NONE SEEN   Trich, Wet Prep NONE SEEN NONE SEEN   Clue Cells Wet Prep HPF POC NONE SEEN NONE SEEN   WBC, Wet Prep HPF POC >=10 (A) <10   Sperm NONE SEEN      US OB LESS THAN 14 WEEKS WITH OB TRANSVAGINAL  Result Date: 10/25/2021 CLINICAL DATA:  Pregnant, cramping EXAM: OBSTETRIC <14 WK [place] AND TRANSVAGINAL OB US TECHNIQUE: Both transabdominal and transvaginal ultrasound examinations were performed for complete evaluation of the gestation as well as the maternal uterus, adnexal regions, and pelvic cul-de-sac. Transvaginal technique was performed to assess early pregnancy. COMPARISON:  None Available. FINDINGS: Intrauterine gestational sac: Single Yolk sac:  Not Visualized. Embryo:  Not Visualized. Cardiac Activity: Not Visualized. MSD: 3.6 mm   5 w   1 d Subchorionic hemorrhage:  None. Maternal uterus/adnexae: Bilateral ovaries are within normal limits, noting a small right corpus luteum, physiologic. Trace pelvic fluid. IMPRESSION: Single intrauterine gestational sac, measuring 5 weeks 1 day by mean sac diameter. No yolk sac or fetal pole is visualized, likely related to small gestational size. Consider follow-up pelvic ultrasound in 14 days to confirm viability. Electronically Signed   By: Charline Bills M.D.   On: 10/25/2021 21:28      MDM Labs ordered and reviewed.   UA, UPT CBC, HCG ABO/Rh-  Wet prep and gc/chlamydia US OB Comp Less 14 weeks with  Transvaginal  CNM independently reviewed the imaging ordered. Imaging show intrauterine gestational sac with no yolk sac or fetal pole  CNM discussed with patient recommendation for repeat ultrasound in 2 weeks for viability. Appointment made for 7/25.   Assessment and Plan   1. Early stage of pregnancy   2. [redacted] weeks gestation of pregnancy   3. Fall, initial encounter     -Discharge home in stable condition -First trimester precautions discussed -Patient advised to follow-up with [facility] on 7/25 for repeat ultrasound -Patient may return to MAU as needed or if her condition were to change or worsen  Rolm Bookbinder, CNM 10/25/2021, 9:57 PM

## 2021-10-25 NOTE — MAU Note (Signed)
Tami Powers is a 31 y.o. at Unknown here in MAU reporting: +HPT a couple wks ago. Has not been seen yet.  Was carrying up the steps, tripped, landed where her knee hit her lower abd. Has been cramping in lower abd since.  No bleeding LMP: june Onset of complaint: 1400 Pain score: 4/5 Vitals:   10/25/21 1855  BP: (!) 96/54  Pulse: 86  Resp: 17  Temp: 98.4 F (36.9 C)  SpO2: 99%      Lab orders placed from triage:  urine sent

## 2021-10-26 LAB — GC/CHLAMYDIA PROBE AMP (~~LOC~~) NOT AT ARMC
Chlamydia: NEGATIVE
Comment: NEGATIVE
Comment: NORMAL
Neisseria Gonorrhea: NEGATIVE

## 2021-11-08 ENCOUNTER — Ambulatory Visit (INDEPENDENT_AMBULATORY_CARE_PROVIDER_SITE_OTHER): Payer: Medicaid Other

## 2021-11-08 ENCOUNTER — Other Ambulatory Visit: Payer: Self-pay

## 2021-11-08 VITALS — BP 96/60 | HR 77 | Temp 98.2°F

## 2021-11-08 DIAGNOSIS — O3680X0 Pregnancy with inconclusive fetal viability, not applicable or unspecified: Secondary | ICD-10-CM

## 2021-11-08 DIAGNOSIS — Z3491 Encounter for supervision of normal pregnancy, unspecified, first trimester: Secondary | ICD-10-CM | POA: Diagnosis not present

## 2021-11-08 DIAGNOSIS — Z3A01 Less than 8 weeks gestation of pregnancy: Secondary | ICD-10-CM | POA: Diagnosis not present

## 2021-11-08 NOTE — Patient Instructions (Signed)

## 2021-11-08 NOTE — Progress Notes (Signed)
   GYNECOLOGY OFFICE VISIT NOTE  History:   Tami Powers is a 31 y.o. [id number] here today for ultrasound results.  Ultrasound shows live IUP at 6 weeks 6 days with FHR 123 bpm.  Abdominal pain: No   Vaginal bleeding: No    OB History  Gravida Para Term Preterm AB Living  3 2 2     2   SAB IAB Ectopic Multiple Live Births        0 2    # Outcome Date GA Lbr Len/2nd Weight Sex Delivery Anes PTL Lv  3 Current           2 Term 05/07/15 [redacted]w[redacted]d 08:47 / 00:16 9 lb 6.1 oz (4.255 kg) F Vag-Spont EPI  LIV  1 Term 11/17/09 [redacted]w[redacted]d   F Vag-Spont EPI N LIV     Birth Comments: vagninal tear     Health Maintenance Due  Topic Date Due   COVID-19 Vaccine (1) Never done   Hepatitis C Screening  Never done   TETANUS/TDAP  Never done   PAP-Cervical Cytology Screening  12/01/2017   PAP SMEAR-Modifier  12/01/2017    Past Medical History:  Diagnosis Date   Anxiety     Past Surgical History:  Procedure Laterality Date   WISDOM TOOTH EXTRACTION      The following portions of the patient's history were reviewed and updated as appropriate: allergies, current medications, past family history, past medical history, past social history, past surgical history and problem list.   Review of Systems:  Pertinent items noted in HPI and remainder of comprehensive ROS otherwise negative.  Physical Exam:  BP 96/60   Pulse 77   Temp 98.2 F (36.8 C)   LMP 09/17/2021  CONSTITUTIONAL: Well-developed, well-nourished female in no acute distress.  HEENT:  Normocephalic, atraumatic. External right and left ear normal. No scleral icterus.  NECK: Normal range of motion, supple, no masses noted on observation SKIN: No rash noted. Not diaphoretic. No erythema. No pallor. MUSCULOSKELETAL: Normal range of motion. No edema noted. NEUROLOGIC: Alert and oriented to person, place, and time. Normal muscle tone coordination.  PSYCHIATRIC: Normal mood and affect. Normal behavior. Normal judgment and thought  content. RESPIRATORY: Effort normal, no problems with respiration noted  Assessment and Plan:  Viable Pregnancy - Z34.90  Reviewed results with patient that show a viable intrauterine pregnancy at 6 weeks of gestation. Recommended she contact providers to start prenatal care, and she was given a list of options in her AVS. Prescription sent for prenatal vitamins. Reviewed ED precautions including vaginal bleeding like a period or heavier, severe abdominal pain, and fever. All questions answered.   Please refer to After Visit Summary for other counseling recommendations.   Patient to follow up with OB to establish prenatal care. Patient is planning to go to [facility] in [place]  Total face-to-face time with patient: 20 minutes.  Over 50% of encounter was spent on counseling and coordination of care.   [place], CNM [facility]

## 2021-12-06 ENCOUNTER — Telehealth: Payer: Medicaid Other

## 2023-11-13 ENCOUNTER — Telehealth: Payer: Self-pay

## 2024-02-19 ENCOUNTER — Encounter (HOSPITAL_COMMUNITY): Payer: Self-pay | Admitting: Obstetrics & Gynecology

## 2024-02-19 ENCOUNTER — Inpatient Hospital Stay (HOSPITAL_COMMUNITY)
Admission: AD | Admit: 2024-02-19 | Discharge: 2024-02-19 | Disposition: A | Discharge status: Home / Self Care | Attending: Obstetrics & Gynecology | Admitting: Obstetrics & Gynecology

## 2024-02-19 ENCOUNTER — Other Ambulatory Visit: Payer: Self-pay

## 2024-02-19 DIAGNOSIS — N898 Other specified noninflammatory disorders of vagina: Secondary | ICD-10-CM | POA: Insufficient documentation

## 2024-02-19 DIAGNOSIS — Z0371 Encounter for suspected problem with amniotic cavity and membrane ruled out: Secondary | ICD-10-CM | POA: Insufficient documentation

## 2024-02-19 DIAGNOSIS — O26893 Other specified pregnancy related conditions, third trimester: Secondary | ICD-10-CM | POA: Insufficient documentation

## 2024-02-19 DIAGNOSIS — Z5329 Procedure and treatment not carried out because of patient's decision for other reasons: Secondary | ICD-10-CM | POA: Insufficient documentation

## 2024-02-19 DIAGNOSIS — Z3A38 38 weeks gestation of pregnancy: Secondary | ICD-10-CM | POA: Insufficient documentation

## 2024-02-19 LAB — POCT FERN TEST: POCT Fern Test: POSITIVE

## 2024-02-19 NOTE — Telephone Encounter (Signed)
 Call received from PACT, patient on the line. Name and DOB verified.  S: Patient calling to report SROM. Currently en route to [facility] via EMS.  B: Patient is currently [redacted] weeks pregnant. Pregnancy is c/b fetal bowel anomaly requiring delivery at [facility].  A: Patient reports her water broke and she contacted EMS. They are currently taking her to [facility] instead of [facility]. She is inquiring if we can arrange a transfer for her. She denies CTX's, VB, and confirms +FM.  R: Advised patient that she should continue to be taken to [facility] and once she has been evaluated and her and baby are deemed as stable, they can arrange for transfer. Advised patient to let triage staff know she needs to deliver at [facility]. She voiced understanding.

## 2024-02-19 NOTE — MAU Note (Signed)
 Tami Powers is a 33 y.o. at [redacted]w[redacted]d here in MAU reporting: receiving prenatal care at [facility] in [place] for fetus that has a dilated bowel and has a POC for baby. Pt desires to be transferred to [facility]. SROM at 1430 today. She reports +FMs, Denies VB, regular contractions, blurry vision, headaches, peripheral edema, or RUQ pain.  Pos [place] Onset of complaint: 1430 Pain score: 0/10 Vitals:   02/19/24 1559  BP: 118/67  Pulse: (!) 116  Resp: 18  SpO2: 98%     FHT: 135  Lab orders placed from triage: fern

## 2024-02-19 NOTE — MAU Note (Signed)
 This RN called to pts room by FOB-- pt states this is taking too long, I am not hurting and am leaving. I need to get to [facility] in [place]. Monitors off and pt up to dress. Pt refused to sign AMA form. Pt left ambulatory but leaking of fluid.

## 2024-02-19 NOTE — Telephone Encounter (Signed)
 Pre-admission call completed.  Education provided on arrival information, standard procedures in [facility], and timing of anticipated discharge.  Patient aware of need for car seat and identification for birth certificate. Lab results reviewed and all questions answered.  Plans to use [employer]) for pediatrician. Main support person for delivery will be Justo).    Expecting Baby: female   Birth Medications: Yes Feeding Method: breast Birth Plan: No Educated on birth certificate process and expectations for completion day of birth: Yes Carseat: Yes Rubella: immune Varicella: immune TDAP: 12-18-23 Flu: declines  RSV: pt unsure if she received

## 2024-02-19 NOTE — Telephone Encounter (Signed)
 Ob patient who is 38 weeks calling stating her water broke and she is waiting in her driveway for EMS to take her to [facility].
